# Patient Record
Sex: Female | Born: 1995 | Race: Black or African American | Hispanic: No | Marital: Single | Smoking: Never smoker
Health system: Southern US, Community
[De-identification: ages and names within clinical notes are randomized; demographics above are authoritative.]

## PROBLEM LIST (undated history)

## (undated) DIAGNOSIS — A749 Chlamydial infection, unspecified: Secondary | ICD-10-CM

## (undated) HISTORY — PX: NO PAST SURGERIES: SHX2092

---
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
## 2015-02-14 ENCOUNTER — Inpatient Hospital Stay (HOSPITAL_COMMUNITY)
Admission: AD | Admit: 2015-02-14 | Discharge: 2015-02-14 | Disposition: A | Discharge status: Home / Self Care | Payer: Medicaid Other | Source: Ambulatory Visit | Attending: Obstetrics & Gynecology | Admitting: Obstetrics & Gynecology

## 2015-02-14 ENCOUNTER — Encounter (HOSPITAL_COMMUNITY): Payer: Self-pay | Admitting: *Deleted

## 2015-02-14 DIAGNOSIS — A599 Trichomoniasis, unspecified: Secondary | ICD-10-CM

## 2015-02-14 DIAGNOSIS — N912 Amenorrhea, unspecified: Secondary | ICD-10-CM | POA: Insufficient documentation

## 2015-02-14 DIAGNOSIS — Z3202 Encounter for pregnancy test, result negative: Secondary | ICD-10-CM | POA: Insufficient documentation

## 2015-02-14 DIAGNOSIS — N926 Irregular menstruation, unspecified: Secondary | ICD-10-CM

## 2015-02-14 HISTORY — DX: Chlamydial infection, unspecified: A74.9

## 2015-02-14 LAB — URINALYSIS, ROUTINE W REFLEX MICROSCOPIC
BILIRUBIN URINE: NEGATIVE
Glucose, UA: NEGATIVE mg/dL
Ketones, ur: NEGATIVE mg/dL
Leukocytes, UA: NEGATIVE
NITRITE: NEGATIVE
PH: 6 (ref 5.0–8.0)
Protein, ur: NEGATIVE mg/dL

## 2015-02-14 LAB — URINE MICROSCOPIC-ADD ON

## 2015-02-14 LAB — POCT PREGNANCY, URINE: Preg Test, Ur: NEGATIVE

## 2015-02-14 MED ORDER — METRONIDAZOLE 500 MG PO TABS
2000.0000 mg | ORAL_TABLET | Freq: Once | ORAL | Status: AC
  Administered 2015-02-14: 2000 mg via ORAL
  Filled 2015-02-14: qty 4

## 2015-02-14 NOTE — MAU Note (Signed)
Pt reports she has been having pelvic pain for a few weeks. LMP 12/27/15. Unsure if she is pregnant. No vag discharge.

## 2015-02-14 NOTE — MAU Provider Note (Signed)
History     CSN: [id number]  Arrival date and time: 02/14/15 1443   First Provider Initiated Contact with Patient 02/14/15 1603      Chief Complaint  Patient presents with  . Pelvic Pain   HPI   [name] is a 20 y.o. female G1P0010 presenting to MAU with complaints of pelvic pain and today desires a pregnancy hcg blood level. The patient missed her period and wants to know for sure whether she is pregnant. She had a negative pregnancy test at home.  The abdominal pain that she complains of started at few days ago; the pain is located in the left side of her uterus. She has not taken anything for the pain.   The patient declines a pelvic exam today   Patient is not interested in [facility] pills today.   OB History    Gravida Para Term Preterm AB TAB SAB Ectopic Multiple Living   1    1           Past Medical History  Diagnosis Date  . Chlamydia     No past surgical history on file.  History reviewed. No pertinent family history.  Social History  Substance Use Topics  . Smoking status: Never Smoker   . Smokeless tobacco: None  . Alcohol Use: No    Allergies: No Known Allergies  Prescriptions prior to admission  Medication Sig Dispense Refill Last Dose  . azithromycin (ZITHROMAX Z-PAK) 500 MG tablet Take both tabs at once today. 2 each 0 Completed Course at Unknown  . doxycycline (VIBRAMYCIN) 100 MG capsule Take 1 capsule (100 mg total) by mouth 2 (two) times daily. 28 capsule 0    Results for orders placed or performed during the hospital encounter of 02/14/15 (from the past 48 hour(s))  Urinalysis, Routine w reflex microscopic (not at [facility])     Status: Abnormal   Collection Time: 02/14/15  2:33 PM  Result Value Ref Range   Color, Urine YELLOW YELLOW   APPearance CLEAR CLEAR   Specific Gravity, Urine >1.030 (H) 1.005 - 1.030   pH 6.0 5.0 - 8.0   Glucose, UA NEGATIVE NEGATIVE mg/dL   Hgb urine dipstick SMALL (A) NEGATIVE   Bilirubin Urine NEGATIVE  NEGATIVE   Ketones, ur NEGATIVE NEGATIVE mg/dL   Protein, ur NEGATIVE NEGATIVE mg/dL   Nitrite NEGATIVE NEGATIVE   Leukocytes, UA NEGATIVE NEGATIVE  Urine microscopic-add on     Status: Abnormal   Collection Time: 02/14/15  2:33 PM  Result Value Ref Range   Squamous Epithelial / LPF 0-5 (A) NONE SEEN   WBC, UA 0-5 0 - 5 WBC/hpf   RBC / HPF 0-5 0 - 5 RBC/hpf   Bacteria, UA RARE (A) NONE SEEN   Urine-Other MUCOUS PRESENT     Comment: TRICHOMONAS PRESENT  Pregnancy, urine POC     Status: None   Collection Time: 02/14/15  2:57 PM  Result Value Ref Range   Preg Test, Ur NEGATIVE NEGATIVE    Comment:        THE SENSITIVITY OF THIS METHODOLOGY IS >24 mIU/mL      Review of Systems  Constitutional: Negative for fever and chills.  Gastrointestinal: Positive for abdominal pain. Negative for nausea and vomiting.  Genitourinary: Negative for dysuria.   Physical Exam   Blood pressure 123/95, pulse 78, temperature 98.8 F (37.1 C), temperature source Oral, resp. rate 18, height 5' 6.5" (1.689 m), last menstrual period 12/27/2014.  Physical Exam  Constitutional:  She is oriented to person, place, and time. She appears well-developed and well-nourished. No distress.  HENT:  Head: Normocephalic.  Neurological: She is alert and oriented to person, place, and time.  Skin: She is not diaphoretic.  Psychiatric: Her affect is inappropriate. She expresses impulsivity and inappropriate judgment (burping out loud; talking about drinking vodka during interview).    MAU Course  Procedures  None  MDM  HIV pending  Trichomonas present in the urine.  Patient's friend present at the time of visit. The patient approved personal information being shared in front of visitor.  Patient's visitor upset that I would not draw an Hcg level today.  Patient's friend says "when did that change, I have come here in the past for a blood test." I explained to the patient that I did not have a medical reason to  perform a beta hcg blood level, the patient was agreeable.   Flagyl 2 grams po given in MAU Expedited partner therapy; patient given expedited partner therapy info to be given to her partner  Assessment and Plan   A:  1. Missed period   2. Trichomonas infection     P:  Discharge home in stable condition Condoms always Return to MAU for emergencies Home pregnancy test in one week if needed    Lezlie Lye, NP 02/14/2015 7:13 PM

## 2015-02-14 NOTE — Discharge Instructions (Signed)
Pregnancy Test Information WHAT IS A PREGNANCY TEST? A pregnancy test is used to detect the presence of human chorionic gonadotropin (hCG) in a sample of your urine or blood. hCG is a hormone produced by the cells of the placenta. The placenta is the organ that forms to nourish and support a developing baby. This test requires a sample of either blood or urine. A pregnancy test determines whether you are pregnant or not. HOW ARE PREGNANCY TESTS DONE? Pregnancy tests are done using a home pregnancy test or having a blood or urine test done at your health care provider's office.  Home pregnancy tests require a urine sample.  Most kits use a plastic testing device with a strip of paper that indicates whether there is hCG in your urine.  Follow the test instructions very carefully.  After you urinate on the test stick, markings will appear to let you know whether you are pregnant.  For best results, use your first urine of the morning. That is when the concentration of hCG is highest. Having a blood test to check for pregnancy requires a sample of blood drawn from a vein in your hand or arm. Your health care provider will send your sample to a lab for testing. Results of a pregnancy test will be positive or negative. IS ONE TYPE OF PREGNANCY TEST BETTER THAN ANOTHER? In some cases, a blood test will return a positive result even if a urine test was negative because blood tests are more sensitive. This means blood tests can detect hCG earlier than home pregnancy tests.  HOW ACCURATE ARE HOME PREGNANCY TESTS?  Both types of pregnancy tests are very accurate.  A blood test is about 98% accurate.  When you are far enough along in your pregnancy and when used correctly, home pregnancy tests are equally accurate. CAN ANYTHING INTERFERE WITH HOME PREGNANCY TEST RESULTS?  It is possible for certain conditions to cause an inaccurate test result (false positive or falsenegative).  A false positive is a  positive test result when you are not pregnant. This can happen if you:  Are taking certain medicines, including anticonvulsants or tranquilizers.  Have certain proteins in your blood.  A false negative is a negative test result when you are pregnant. This can happen if you:  Took the test before there was enough hCG to detect. A pregnancy test will not be positive in most women until 3-4 weeks after conception.  Drank a lot of liquid before the test. Diluted urine samples can sometimes give an inaccurate result.  Take certain medicines, such as water pills (diuretics) or some antihistamines. WHAT SHOULD I DO IF I HAVE A POSITIVE PREGNANCY TEST? If you have a positive pregnancy test, schedule an appointment with your health care provider. You might need additional testing to confirm the pregnancy. In the meantime, begin taking a prenatal vitamin, stop smoking, stop drinking alcohol, and do not use street drugs. Talk to your health care provider about how to take care of yourself during your pregnancy. Ask about what to expect from the care you will need throughout pregnancy (prenatal care).   This information is not intended to replace advice given to you by your health care provider. Make sure you discuss any questions you have with your health care provider.   Document Released: 01/01/2003 Document Revised: 01/19/2014 Document Reviewed: 04/25/2013 Elsevier Interactive Patient Education 2016 [employer]. Trichomoniasis Trichomoniasis is an infection caused by an organism called Trichomonas. The infection can affect both women and  men. In women, the outer female genitalia and the vagina are affected. In men, the penis is mainly affected, but the prostate and other reproductive organs can also be involved. Trichomoniasis is a sexually transmitted infection (STI) and is most often passed to another person through sexual contact.  RISK FACTORS  Having unprotected sexual intercourse.  Having  sexual intercourse with an infected partner. SIGNS AND SYMPTOMS  Symptoms of trichomoniasis in women include:  Abnormal gray-green frothy vaginal discharge.  Itching and irritation of the vagina.  Itching and irritation of the area outside the vagina. Symptoms of trichomoniasis in men include:   Penile discharge with or without pain.  Pain during urination. This results from inflammation of the urethra. DIAGNOSIS  Trichomoniasis may be found during a Pap test or physical exam. Your health care provider may use one of the following methods to help diagnose this infection:  Testing the pH of the vagina with a test tape.  Using a vaginal swab test that checks for the Trichomonas organism. A test is available that provides results within a few minutes.  Examining a urine sample.  Testing vaginal secretions. Your health care provider may test you for other STIs, including HIV. TREATMENT   You may be given medicine to fight the infection. Women should inform their health care provider if they could be or are pregnant. Some medicines used to treat the infection should not be taken during pregnancy.  Your health care provider may recommend over-the-counter medicines or creams to decrease itching or irritation.  Your sexual partner will need to be treated if infected.  Your health care provider may test you for infection again 3 months after treatment. HOME CARE INSTRUCTIONS   Take medicines only as directed by your health care provider.  Take over-the-counter medicine for itching or irritation as directed by your health care provider.  Do not have sexual intercourse while you have the infection.  Women should not douche or wear tampons while they have the infection.  Discuss your infection with your partner. Your partner may have gotten the infection from you, or you may have gotten it from your partner.  Have your sex partner get examined and treated if necessary.  Practice  safe, informed, and protected sex.  See your health care provider for other STI testing. SEEK MEDICAL CARE IF:   You still have symptoms after you finish your medicine.  You develop abdominal pain.  You have pain when you urinate.  You have bleeding after sexual intercourse.  You develop a rash.  Your medicine makes you sick or makes you throw up (vomit). MAKE SURE YOU:  Understand these instructions.  Will watch your condition.  Will get help right away if you are not doing well or get worse.   This information is not intended to replace advice given to you by your health care provider. Make sure you discuss any questions you have with your health care provider.   Document Released: 06/24/2000 Document Revised: 01/19/2014 Document Reviewed: 10/10/2012 Elsevier Interactive Patient Education 2016 [employer].    Expedited Partner Therapy:  Information Sheet for Patients and Partners               You have been offered expedited partner therapy (EPT). This information sheet contains important information and warnings you need to be aware of, so please read it carefully.   Expedited Partner Therapy (EPT) is the clinical practice of treating the sexual partners of persons who receive chlamydia, gonorrhea, or trichomoniasis  diagnoses by providing medications or prescriptions to the patient. Patients then provide partners with these therapies without the health-care provider having examined the partner. In other words, EPT is a convenient, fast and private way for patients to help their sexual partners get treated.   Chlamydia and gonorrhea are bacterial infections you get from having sex with a person who is already infected. Trichomoniasis (or trich) is a very common sexually transmitted infection (STI) that is caused by infection with a protozoan parasite called Trichomonas vaginalis.  Many people with these infections dont know it because they feel fine, but without  treatment these infections can cause serious health problems, such as pelvic inflammatory disease, ectopic pregnancy, infertility and increased risk of HIV.   It is important to get treated as soon as possible to protect your health, to avoid spreading these infections to others, and to prevent yourself from becoming re-infected. The good news is these infections can be easily cured with proper antibiotic medicine. The best way to take care of your self is to see a doctor or go to your local health department. If you are not able to see a doctor or other medical provider, you should take EPT.    Recommended Medication: EPT for Chlamydia:  Azithromycin (Zithromax) 1 gram orally in a single dose EPT for Gonorrhea:  Cefixime (Suprax) 400 milligrams orally in a single dose PLUS azithromycin (Zithromax) 1 gram orally in a single dose EPT for Trichomoniasis:  Metronidazole (Flagyl) 2 grams orally in a single dose   These medicines are very safe. However, you should not take them if you have ever had an allergic reaction (like a rash) to any of these medicines: azithromycin (Zithromax), erythromycin, clarithromycin (Biaxin), metronidazole (Flagyl), tinidazole (Tindimax). If you are uncertain about whether you have an allergy, call your medical provider or pharmacist before taking this medicine. If you have a serious, long-term illness like kidney, liver or heart disease, colitis or stomach problems, or you are currently taking other prescription medication, talk to your provider before taking this medication.   Women: If you have lower belly pain, pain during sex, vomiting, or a fever, do not take this medicine. Instead, you should see a medical provider to be certain you do not have pelvic inflammatory disease (PID). PID can be serious and lead to infertility, pregnancy problems or chronic pelvic pain.   Pregnant Women: It is very important for you to see a doctor to get pregnancy services and pre-natal  care. These antibiotics for EPT are safe for pregnant women, but you still need to see a medical provider as soon as possible. It is also important to note that Doxycycline is an alternative therapy for chlamydia, but it should not be taken by someone who is pregnant.   Men: If you have pain or swelling in the testicles or a fever, do not take this medicine and see a medical provider.     Men who have sex with men (MSM): MSM in [place] continue to experience high rates of syphilis and HIV. Many MSM with gonorrhea or chlamydia could also have syphilis and/or HIV and not know it. If you are a man who has sex with other men, it is very important that you see a medical provider and are tested for HIV and syphilis. EPT is not recommended for gonorrhea for MSM.  Recommended treatment for gonorrhea for MSM is Rocephin (shot) AND azithromycin due to decreased cure rate.  Please see your medical provider if this is  the case.    Along with this information sheet is a prescription for the medicine. If you receive a prescription it will be in your name and will indicate your date of birth, or it will be in the name of Expedited Partner Therapy.   In either case, you can have the prescription filled at a pharmacy. You will be responsible for the cost of the medicine, unless you have prescription drug coverage. In that case, you could provide your name so the pharmacy could bill your health plan.   Take the medication as directed. Some people will have a mild, upset stomach, which does not last long. AVOID alcohol 24 hours after taking metronidazole (Flagyl) to reduce the possibility of a disulfiram-like reaction (severe vomiting and abdominal pain).  After taking the medicine, do not have sex for 7 days. Do not share this medicine or give it to anyone else. It is important to tell everyone you have had sex with in the last 60 days that they need to go and get tested for sexually transmitted infections.   Ways  to prevent these and other sexually transmitted infections (STIs):    Abstain from sex. This is the only sure way to avoid getting an STI.   Use barrier methods, such as condoms, consistently and correctly.   Limit the number of sexual partners.   Have regular physical exams, including testing for STIs.   For more information about EPT or other issues pertaining to an STI, please contact your medical provider or the [facility] Department at [phone] or [email or web address]

## 2015-02-15 LAB — HIV ANTIBODY (ROUTINE TESTING W REFLEX): HIV SCREEN 4TH GENERATION: NONREACTIVE

## 2015-03-15 ENCOUNTER — Emergency Department (HOSPITAL_COMMUNITY)
Admission: EM | Admit: 2015-03-15 | Discharge: 2015-03-15 | Disposition: A | Discharge status: Home / Self Care | Payer: Medicaid Other | Attending: Emergency Medicine | Admitting: Emergency Medicine

## 2015-03-15 ENCOUNTER — Encounter (HOSPITAL_COMMUNITY): Payer: Self-pay | Admitting: Emergency Medicine

## 2015-03-15 DIAGNOSIS — Z8619 Personal history of other infectious and parasitic diseases: Secondary | ICD-10-CM | POA: Insufficient documentation

## 2015-03-15 DIAGNOSIS — N898 Other specified noninflammatory disorders of vagina: Secondary | ICD-10-CM | POA: Insufficient documentation

## 2015-03-15 DIAGNOSIS — Z3202 Encounter for pregnancy test, result negative: Secondary | ICD-10-CM | POA: Insufficient documentation

## 2015-03-15 LAB — URINALYSIS, ROUTINE W REFLEX MICROSCOPIC
BILIRUBIN URINE: NEGATIVE
Glucose, UA: NEGATIVE mg/dL
Hgb urine dipstick: NEGATIVE
KETONES UR: NEGATIVE mg/dL
LEUKOCYTES UA: NEGATIVE
NITRITE: NEGATIVE
PROTEIN: NEGATIVE mg/dL
Specific Gravity, Urine: 1.024 (ref 1.005–1.030)
pH: 6 (ref 5.0–8.0)

## 2015-03-15 LAB — POC URINE PREG, ED: PREG TEST UR: NEGATIVE

## 2015-03-15 MED ORDER — METRONIDAZOLE 500 MG PO TABS
2000.0000 mg | ORAL_TABLET | Freq: Once | ORAL | Status: AC
  Administered 2015-03-15: 2000 mg via ORAL
  Filled 2015-03-15: qty 4

## 2015-03-15 NOTE — ED Notes (Signed)
Pt sts recent hx of trichomonas and was treated but still having sx

## 2015-03-15 NOTE — ED Provider Notes (Signed)
CSN: [id number]     Arrival date & time 03/15/15  1036 History   First MD Initiated Contact with Patient 03/15/15 1129     Chief Complaint  Patient presents with  . Vaginal Discharge     Patient is a 20 y.o. female presenting with vaginal discharge. The history is provided by the patient. No language interpreter was used.  Vaginal Discharge  Julie Maddox is a 20 y.o. female who presents to the Emergency Department complaining of vaginal discharge.  She was seen at [facility] one month ago for a foul-smelling vaginal discharge and was diagnosed with Trichomonas. She was treated at that time and she also gave her partner a prescription for treatment. She is unsure if her partner took the treatment or not. For the last couple of days she reports increased foul-smelling vaginal discharge is white in color. She has a "funny feeling" in her lower abdomen but no abdominal pain. She denies any fevers, nausea, vomiting, dysuria.  Past Medical History  Diagnosis Date  . Chlamydia    History reviewed. No pertinent past surgical history. History reviewed. No pertinent family history. Social History  Substance Use Topics  . Smoking status: Never Smoker   . Smokeless tobacco: None  . Alcohol Use: No   OB History    Gravida Para Term Preterm AB TAB SAB Ectopic Multiple Living   1 0 0 0 1 1 0 0 0 0      Review of Systems  Genitourinary: Positive for vaginal discharge.  All other systems reviewed and are negative.     Allergies  Review of patient's allergies indicates no known allergies.  Home Medications   Prior to Admission medications   Not on File   BP 126/95 mmHg  Pulse 72  Temp(Src) 98 F (36.7 C) (Oral)  Resp 18  SpO2 100%  LMP 12/27/2014 Physical Exam  Constitutional: She is oriented to person, place, and time. She appears well-developed and well-nourished.  HENT:  Head: Normocephalic and atraumatic.  Cardiovascular: Normal rate and regular rhythm.   No murmur  heard. Pulmonary/Chest: Effort normal and breath sounds normal. No respiratory distress.  Abdominal: Soft. There is no tenderness. There is no rebound and no guarding.  Musculoskeletal: She exhibits no edema or tenderness.  Neurological: She is alert and oriented to person, place, and time.  Skin: Skin is warm and dry.  Psychiatric: She has a normal mood and affect. Her behavior is normal.  Nursing note and vitals reviewed.   ED Course  Procedures (including critical care time) Labs Review Labs Reviewed  URINALYSIS, ROUTINE W REFLEX MICROSCOPIC (NOT AT [facility], [facility], [facility])  POC URINE PREG, ED    Imaging Review No results found. I have personally reviewed and evaluated these images and lab results as part of my medical decision-making.   EKG Interpretation None      MDM   Final diagnoses:  Vaginal discharge    Patient here for evaluation of vaginal discharge with history of Trichomonas. She has no abdominal pain or abdominal tenderness on examination. Patient refuses pelvic examination because she has to leave. We will treat for possible trichomonas. Discussed with patient that she needs further follow-up with pelvic examination to evaluate for sexual transmitted infection such as gonorrhea and chlamydia.    Quintella Reichert, MD 03/15/15 1151

## 2015-03-15 NOTE — ED Notes (Signed)
Discharge vitals in.  Pt getting dressed.

## 2015-03-15 NOTE — Discharge Instructions (Signed)
Trichomoniasis Trichomoniasis is an infection caused by an organism called Trichomonas. The infection can affect both women and men. In women, the outer female genitalia and the vagina are affected. In men, the penis is mainly affected, but the prostate and other reproductive organs can also be involved. Trichomoniasis is a sexually transmitted infection (STI) and is most often passed to another person through sexual contact.  RISK FACTORS  Having unprotected sexual intercourse.  Having sexual intercourse with an infected partner. SIGNS AND SYMPTOMS  Symptoms of trichomoniasis in women include:  Abnormal gray-green frothy vaginal discharge.  Itching and irritation of the vagina.  Itching and irritation of the area outside the vagina. Symptoms of trichomoniasis in men include:   Penile discharge with or without pain.  Pain during urination. This results from inflammation of the urethra. DIAGNOSIS  Trichomoniasis may be found during a Pap test or physical exam. Your health care provider may use one of the following methods to help diagnose this infection:  Testing the pH of the vagina with a test tape.  Using a vaginal swab test that checks for the Trichomonas organism. A test is available that provides results within a few minutes.  Examining a urine sample.  Testing vaginal secretions. Your health care provider may test you for other STIs, including HIV. TREATMENT   You may be given medicine to fight the infection. Women should inform their health care provider if they could be or are pregnant. Some medicines used to treat the infection should not be taken during pregnancy.  Your health care provider may recommend over-the-counter medicines or creams to decrease itching or irritation.  Your sexual partner will need to be treated if infected.  Your health care provider may test you for infection again 3 months after treatment. HOME CARE INSTRUCTIONS   Take medicines only as  directed by your health care provider.  Take over-the-counter medicine for itching or irritation as directed by your health care provider.  Do not have sexual intercourse while you have the infection.  Women should not douche or wear tampons while they have the infection.  Discuss your infection with your partner. Your partner may have gotten the infection from you, or you may have gotten it from your partner.  Have your sex partner get examined and treated if necessary.  Practice safe, informed, and protected sex.  See your health care provider for other STI testing. SEEK MEDICAL CARE IF:   You still have symptoms after you finish your medicine.  You develop abdominal pain.  You have pain when you urinate.  You have bleeding after sexual intercourse.  You develop a rash.  Your medicine makes you sick or makes you throw up (vomit). MAKE SURE YOU:  Understand these instructions.  Will watch your condition.  Will get help right away if you are not doing well or get worse.   This information is not intended to replace advice given to you by your health care provider. Make sure you discuss any questions you have with your health care provider.   Document Released: 06/24/2000 Document Revised: 01/19/2014 Document Reviewed: 10/10/2012 Elsevier Interactive Patient Education 2016 [employer]  

## 2015-03-15 NOTE — ED Notes (Signed)
[facility] and found to have ytrich and she gave notice to her boyfriend but she is unsure if he gotb tx or not now has vag d/c again with odor is in hurry does not want pelvic

## 2016-01-13 NOTE — L&D Delivery Note (Signed)
Patient is 21 y.o. G2P0010 [redacted]w[redacted]d admitted for SOL. Progressed well with expectant management. AROM shortly before delivery with meconium.  Prenatal course also complicated by anxiety not on medications.  Delivery Note At 5:19 PM a viable female was delivered via Vaginal, Spontaneous Delivery (Presentation: compound, LOA).  APGAR: 8/9; weight pending.   Placenta status: to pathology.  Cord: none with no complications.  Cord pH: N/A  Anesthesia:  Epidural Episiotomy: None Lacerations: 2nd degree; combined perineal and labial Suture Repair: 3.0 Vicryl x2 Est. Blood Loss (mL):  350  Mom to postpartum.  Baby to Couplet care / Skin to Skin.   Upon arrival, head was low in the pelvis with +2 station. Patient was complete and pushing well. She pushed with good maternal effort four times. Just before final contraction, heart rate dropped to 80s persistently. At onset of final contraction, she delivered a viable female infant in cephalic, compound (both arms), LOA position. No nuchal cord present. Baby delivered without difficulty (anterior shoulder delivered with ease), was noted to have good tone and place on maternal abdomen for oral suctioning, drying and stimulation. Baby had active passage of meconium and first urination just after delivery. Delayed cord clamping performed. Placenta delivered spontaneously immediately after delivery of infant without need for cord traction. Fundus firm with massage and Pitocin. Perineum inspected and found to have deep 2nd degree laceration with connected right labial laceration. Rectal exam performed with intact anal sphincter. Laceration then repaired with 3-0 Vicryl x2 with good hemostasis achieved. Counts of sharps, instruments, and lap pads were all correct.    Dr. Lambert Keto supervised delivery and repair.  Donnita Falls, MD Family Medicine Resident, PGY-3 09/23/2016 6:09 PM   OB FELLOW DELIVERY ATTESTATION  I was gloved and present for the delivery in its  entirety, and I agree with the above resident's note.    Gailen Shelter, MD OB Fellow 9:06 PM

## 2016-02-27 ENCOUNTER — Ambulatory Visit (INDEPENDENT_AMBULATORY_CARE_PROVIDER_SITE_OTHER): Payer: Medicaid Other | Admitting: Obstetrics & Gynecology

## 2016-02-27 ENCOUNTER — Encounter: Payer: Self-pay | Admitting: Obstetrics & Gynecology

## 2016-02-27 ENCOUNTER — Other Ambulatory Visit (HOSPITAL_COMMUNITY)
Admission: RE | Admit: 2016-02-27 | Discharge: 2016-02-27 | Disposition: A | Discharge status: Home / Self Care | Payer: Medicaid Other | Source: Ambulatory Visit | Attending: Obstetrics & Gynecology | Admitting: Obstetrics & Gynecology

## 2016-02-27 VITALS — BP 119/82 | HR 70 | Wt 137.0 lb

## 2016-02-27 DIAGNOSIS — Z3481 Encounter for supervision of other normal pregnancy, first trimester: Secondary | ICD-10-CM | POA: Diagnosis not present

## 2016-02-27 DIAGNOSIS — Z3401 Encounter for supervision of normal first pregnancy, first trimester: Secondary | ICD-10-CM

## 2016-02-27 DIAGNOSIS — Z23 Encounter for immunization: Secondary | ICD-10-CM

## 2016-02-27 DIAGNOSIS — Z3400 Encounter for supervision of normal first pregnancy, unspecified trimester: Secondary | ICD-10-CM

## 2016-02-27 DIAGNOSIS — Z3490 Encounter for supervision of normal pregnancy, unspecified, unspecified trimester: Secondary | ICD-10-CM | POA: Insufficient documentation

## 2016-02-27 NOTE — Addendum Note (Signed)
Addended by: Maryruth Eve on: 02/27/2016 05:04 PM   Modules accepted: Orders

## 2016-02-27 NOTE — Progress Notes (Signed)
  Subjective:    Julie Maddox is a G2P0010 [redacted]w[redacted]d being seen today for her first obstetrical visit.  Her obstetrical history is significant for first pregnancy. Patient does intend to breast feed. Pregnancy history fully reviewed.  Patient reports nausea and vomiting.  Vitals:   02/27/16 1334  BP: 119/82  Pulse: 70  Weight: 137 lb (62.1 kg)    HISTORY: OB History  Gravida Para Term Preterm AB Living  2 0 0 0 1 0  SAB TAB Ectopic Multiple Live Births  0 1 0 0      # Outcome Date GA Lbr Len/2nd Weight Sex Delivery Anes PTL Lv  2 Current           1 TAB              Past Medical History:  Diagnosis Date  . Chlamydia    History reviewed. No pertinent surgical history. Family History  Problem Relation Age of Onset  . Parkinson's disease Maternal Grandmother   . Cancer Paternal Grandmother      Exam    Uterus:     Pelvic Exam:    Perineum: No Hemorrhoids   Vulva: normal   Vagina:  thin grey discharge   pH:     Cervix: no lesions   Adnexa: no mass, fullness, tenderness   Bony Pelvis: average  System: Breast:  normal appearance, no masses or tenderness   Skin: normal coloration and turgor, no rashes    Neurologic: oriented, normal mood   Extremities: normal strength, tone, and muscle mass   HEENT PERRLA and neck supple with midline trachea   Mouth/Teeth dental hygiene good   Neck supple   Cardiovascular: regular rate and rhythm, no murmurs or gallops   Respiratory:  appears well, vitals normal, no respiratory distress, acyanotic, normal RR, neck free of mass or lymphadenopathy, chest clear, no wheezing, crepitations, rhonchi, normal symmetric air entry   Abdomen: soft, non-tender; bowel sounds normal; no masses,  no organomegaly   Urinary: urethral meatus normal      Assessment:    Pregnancy: G2P0010 Patient Active Problem List   Diagnosis Date Noted  . Encounter for supervision of normal pregnancy, antepartum 02/27/2016        Plan:      Initial labs drawn. Prenatal vitamins. Problem list reviewed and updated. Genetic Screening discussed First Screen: ordered.  Ultrasound discussed; fetal survey: 18+ weeks.  Follow up in 4 weeks. 50% of 30 min visit spent on counseling and coordination of care.  Wet prep sent, need ROI for pap done at [facility] 02/27/2016

## 2016-02-27 NOTE — Progress Notes (Signed)
NOB pt c/o vaginal spotting after wiping last night. Denies cramping and pain. Wants baby scripts.

## 2016-02-29 LAB — OBSTETRIC PANEL, INCLUDING HIV
Antibody Screen: NEGATIVE
BASOS ABS: 0 10*3/uL (ref 0.0–0.2)
Basos: 0 %
EOS (ABSOLUTE): 0.2 10*3/uL (ref 0.0–0.4)
EOS: 3 %
HEMOGLOBIN: 11.9 g/dL (ref 11.1–15.9)
HEP B S AG: NEGATIVE
HIV Screen 4th Generation wRfx: NONREACTIVE
Hematocrit: 34.5 % (ref 34.0–46.6)
IMMATURE GRANS (ABS): 0 10*3/uL (ref 0.0–0.1)
IMMATURE GRANULOCYTES: 0 %
LYMPHS ABS: 1.8 10*3/uL (ref 0.7–3.1)
LYMPHS: 32 %
MCH: 29.9 pg (ref 26.6–33.0)
MCHC: 34.5 g/dL (ref 31.5–35.7)
MCV: 87 fL (ref 79–97)
MONOCYTES: 9 %
Monocytes Absolute: 0.5 10*3/uL (ref 0.1–0.9)
NEUTROS PCT: 56 %
Neutrophils Absolute: 3.1 10*3/uL (ref 1.4–7.0)
PLATELETS: 221 10*3/uL (ref 150–379)
RBC: 3.98 x10E6/uL (ref 3.77–5.28)
RDW: 14.8 % (ref 12.3–15.4)
RH TYPE: POSITIVE
RPR: NONREACTIVE
Rubella Antibodies, IGG: 1.51 index (ref >0.99)
WBC: 5.5 10*3/uL (ref 3.4–10.8)

## 2016-02-29 LAB — HEMOGLOBINOPATHY EVALUATION
HGB A: 97.2 % (ref 96.4–98.8)
HGB C: 0 %
HGB S: 0 %
HGB VARIANT: 0 %
Hemoglobin A2 Quantitation: 2.8 % (ref 1.8–3.2)
Hemoglobin F Quantitation: 0 % (ref 0.0–2.0)

## 2016-02-29 LAB — VARICELLA ZOSTER ANTIBODY, IGG: Varicella zoster IgG: 1130 index (ref >165)

## 2016-03-02 LAB — CERVICOVAGINAL ANCILLARY ONLY: Trichomonas: NEGATIVE

## 2016-03-02 LAB — URINE CYTOLOGY ANCILLARY ONLY
CHLAMYDIA, DNA PROBE: NEGATIVE
Neisseria Gonorrhea: NEGATIVE

## 2016-03-03 LAB — URINE CULTURE, OB REFLEX

## 2016-03-03 LAB — CULTURE, OB URINE

## 2016-03-05 LAB — URINE CYTOLOGY ANCILLARY ONLY: Candida vaginitis: NEGATIVE

## 2016-03-17 ENCOUNTER — Encounter (HOSPITAL_COMMUNITY): Payer: Self-pay

## 2016-03-17 ENCOUNTER — Ambulatory Visit (HOSPITAL_COMMUNITY)
Admission: RE | Admit: 2016-03-17 | Discharge: 2016-03-17 | Disposition: A | Discharge status: Home / Self Care | Payer: Medicaid Other | Source: Ambulatory Visit | Attending: Obstetrics & Gynecology | Admitting: Obstetrics & Gynecology

## 2016-03-17 DIAGNOSIS — Z3400 Encounter for supervision of normal first pregnancy, unspecified trimester: Secondary | ICD-10-CM | POA: Diagnosis present

## 2016-03-17 DIAGNOSIS — Z3A13 13 weeks gestation of pregnancy: Secondary | ICD-10-CM | POA: Insufficient documentation

## 2016-03-17 DIAGNOSIS — Z3682 Encounter for antenatal screening for nuchal translucency: Secondary | ICD-10-CM | POA: Insufficient documentation

## 2016-03-17 NOTE — ED Notes (Signed)
Pt states she ate a brownie which a friend gave her.  Pt hasn't told friend she was pregnant but once she found out it had marijuana baked in it, she discontinued eating.

## 2016-03-19 ENCOUNTER — Telehealth: Payer: Self-pay

## 2016-03-19 NOTE — Telephone Encounter (Signed)
Spoke to patient and advised of results and rx sent. 

## 2016-03-20 ENCOUNTER — Other Ambulatory Visit: Payer: Self-pay

## 2016-03-25 ENCOUNTER — Encounter: Payer: Self-pay | Admitting: *Deleted

## 2016-03-26 ENCOUNTER — Encounter: Payer: Medicaid Other | Admitting: Obstetrics and Gynecology

## 2016-03-31 ENCOUNTER — Telehealth: Payer: Self-pay

## 2016-03-31 DIAGNOSIS — N760 Acute vaginitis: Principal | ICD-10-CM

## 2016-03-31 DIAGNOSIS — B9689 Other specified bacterial agents as the cause of diseases classified elsewhere: Secondary | ICD-10-CM

## 2016-03-31 MED ORDER — METRONIDAZOLE 500 MG PO TABS: 500.0000 mg | ORAL_TABLET | Freq: Two times a day (BID) | ORAL | 0 refills | Status: DC

## 2016-03-31 NOTE — Telephone Encounter (Signed)
Patient called and spoke with front staff and advised that she never received her rx, sent per provider order.

## 2016-04-07 ENCOUNTER — Ambulatory Visit (INDEPENDENT_AMBULATORY_CARE_PROVIDER_SITE_OTHER): Payer: Medicaid Other | Admitting: Obstetrics & Gynecology

## 2016-04-07 VITALS — BP 107/73 | HR 75 | Wt 141.0 lb

## 2016-04-07 DIAGNOSIS — Z3400 Encounter for supervision of normal first pregnancy, unspecified trimester: Secondary | ICD-10-CM

## 2016-04-07 DIAGNOSIS — Z3402 Encounter for supervision of normal first pregnancy, second trimester: Secondary | ICD-10-CM

## 2016-04-07 NOTE — Progress Notes (Signed)
   PRENATAL VISIT NOTE  Subjective:  Julie Maddox is a 21 y.o. G2P0010 at [redacted]w[redacted]d being seen today for ongoing prenatal care.  She is currently monitored for the following issues for this low-risk pregnancy and has Encounter for supervision of normal pregnancy, antepartum on her problem list.  Patient reports no complaints.  Contractions: Regular. Vag. Bleeding: None.  Movement: Present. Denies leaking of fluid.   The following portions of the patient's history were reviewed and updated as appropriate: allergies, current medications, past family history, past medical history, past social history, past surgical history and problem list. Problem list updated.  Objective:   Vitals:   04/07/16 1323  BP: 107/73  Pulse: 75  Weight: 141 lb (64 kg)    Fetal Status: Fetal Heart Rate (bpm): 154 Fundal Height: 16 cm Movement: Present     General:  Alert, oriented and cooperative. Patient is in no acute distress.  Skin: Skin is warm and dry. No rash noted.   Cardiovascular: Normal heart rate noted  Respiratory: Normal respiratory effort, no problems with respiration noted  Abdomen: Soft, gravid, appropriate for gestational age. Pain/Pressure: Absent     Pelvic:  Cervical exam deferred        Extremities: Normal range of motion.  Edema: None  Mental Status: Normal mood and affect. Normal behavior. Normal judgment and thought content.   Assessment and Plan:  Pregnancy: G2P0010 at [redacted]w[redacted]d  1. Supervision of normal first pregnancy, antepartum Normal NT screen - AFP, Serum, Open Spina Bifida  Preterm labor symptoms and general obstetric precautions including but not limited to vaginal bleeding, contractions, leaking of fluid and fetal movement were reviewed in detail with the patient. Please refer to After Visit Summary for other counseling recommendations.  Return in about 4 weeks (around 05/05/2016).   Woodroe Mode, MD

## 2016-04-07 NOTE — Patient Instructions (Signed)

## 2016-04-09 LAB — AFP, SERUM, OPEN SPINA BIFIDA
AFP MoM: 1.17
AFP VALUE AFPOSL: 40.6 ng/mL
GEST. AGE ON COLLECTION DATE: 16.1 wk
Maternal Age At EDD: 23.5 years
OSBR RISK 1 IN: 7137
Test Results:: NEGATIVE
WEIGHT: 141 [lb_av]

## 2016-04-14 ENCOUNTER — Other Ambulatory Visit: Payer: Self-pay | Admitting: *Deleted

## 2016-04-14 DIAGNOSIS — B379 Candidiasis, unspecified: Secondary | ICD-10-CM

## 2016-04-14 DIAGNOSIS — T3695XA Adverse effect of unspecified systemic antibiotic, initial encounter: Principal | ICD-10-CM

## 2016-04-14 MED ORDER — TERCONAZOLE 0.4 % VA CREA: 1.0000 | TOPICAL_CREAM | Freq: Every day | VAGINAL | 0 refills | Status: DC

## 2016-04-14 NOTE — Progress Notes (Signed)
Pt called to office stating signs of yeast infection.  Pt was recently tx for BV.   Terazol 7 sent to pharmacy per protocol. Pt advised to call back if tx does to relieve symptoms.

## 2016-05-05 ENCOUNTER — Ambulatory Visit (INDEPENDENT_AMBULATORY_CARE_PROVIDER_SITE_OTHER): Payer: Medicaid Other | Admitting: Obstetrics and Gynecology

## 2016-05-05 VITALS — BP 113/73 | HR 78 | Wt 142.0 lb

## 2016-05-05 DIAGNOSIS — Z3400 Encounter for supervision of normal first pregnancy, unspecified trimester: Secondary | ICD-10-CM

## 2016-05-05 DIAGNOSIS — R350 Frequency of micturition: Secondary | ICD-10-CM

## 2016-05-05 DIAGNOSIS — Z3482 Encounter for supervision of other normal pregnancy, second trimester: Secondary | ICD-10-CM

## 2016-05-05 LAB — POCT URINALYSIS DIPSTICK
Bilirubin, UA: NEGATIVE
Glucose, UA: NEGATIVE
KETONES UA: NEGATIVE
LEUKOCYTES UA: NEGATIVE
NITRITE UA: NEGATIVE
Spec Grav, UA: <=1.005 — AB (ref 1.010–1.025)
Urobilinogen, UA: 0.2 E.U./dL
pH, UA: 7.5 (ref 5.0–8.0)

## 2016-05-05 NOTE — Patient Instructions (Signed)
Contraception Choices Contraception (birth control) is the use of any methods or devices to prevent pregnancy. Below are some methods to help avoid pregnancy. Hormonal methods  Contraceptive implant. This is a thin, plastic tube containing progesterone hormone. It does not contain estrogen hormone. Your health care provider inserts the tube in the inner part of the upper arm. The tube can remain in place for up to 3 years. After 3 years, the implant must be removed. The implant prevents the ovaries from releasing an egg (ovulation), thickens the cervical mucus to prevent sperm from entering the uterus, and thins the lining of the inside of the uterus.  Progesterone-only injections. These injections are given every 3 months by your health care provider to prevent pregnancy. This synthetic progesterone hormone stops the ovaries from releasing eggs. It also thickens cervical mucus and changes the uterine lining. This makes it harder for sperm to survive in the uterus.  Birth control pills. These pills contain estrogen and progesterone hormone. They work by preventing the ovaries from releasing eggs (ovulation). They also cause the cervical mucus to thicken, preventing the sperm from entering the uterus. Birth control pills are prescribed by a health care provider.Birth control pills can also be used to treat heavy periods.  Minipill. This type of birth control pill contains only the progesterone hormone. They are taken every day of each month and must be prescribed by your health care provider.  Birth control patch. The patch contains hormones similar to those in birth control pills. It must be changed once a week and is prescribed by a health care provider.  Vaginal ring. The ring contains hormones similar to those in birth control pills. It is left in the vagina for 3 weeks, removed for 1 week, and then a new one is put back in place. The patient must be comfortable inserting and removing the ring from  the vagina.A health care provider's prescription is necessary.  Emergency contraception. Emergency contraceptives prevent pregnancy after unprotected sexual intercourse. This pill can be taken right after sex or up to 5 days after unprotected sex. It is most effective the sooner you take the pills after having sexual intercourse. Most emergency contraceptive pills are available without a prescription. Check with your pharmacist. Do not use emergency contraception as your only form of birth control. Barrier methods  Female condom. This is a thin sheath (latex or rubber) that is worn over the penis during sexual intercourse. It can be used with spermicide to increase effectiveness.  Female condom. This is a soft, loose-fitting sheath that is put into the vagina before sexual intercourse.  Diaphragm. This is a soft, latex, dome-shaped barrier that must be fitted by a health care provider. It is inserted into the vagina, along with a spermicidal jelly. It is inserted before intercourse. The diaphragm should be left in the vagina for 6 to 8 hours after intercourse.  Cervical cap. This is a round, soft, latex or plastic cup that fits over the cervix and must be fitted by a health care provider. The cap can be left in place for up to 48 hours after intercourse.  Sponge. This is a soft, circular piece of polyurethane foam. The sponge has spermicide in it. It is inserted into the vagina after wetting it and before sexual intercourse.  Spermicides. These are chemicals that kill or block sperm from entering the cervix and uterus. They come in the form of creams, jellies, suppositories, foam, or tablets. They do not require a prescription. They   are inserted into the vagina with an applicator before having sexual intercourse. The process must be repeated every time you have sexual intercourse. Intrauterine contraception  Intrauterine device (IUD). This is a T-shaped device that is put in a woman's uterus during  a menstrual period to prevent pregnancy. There are 2 types:  Copper IUD. This type of IUD is wrapped in copper wire and is placed inside the uterus. Copper makes the uterus and fallopian tubes produce a fluid that kills sperm. It can stay in place for 10 years.  Hormone IUD. This type of IUD contains the hormone progestin (synthetic progesterone). The hormone thickens the cervical mucus and prevents sperm from entering the uterus, and it also thins the uterine lining to prevent implantation of a fertilized egg. The hormone can weaken or kill the sperm that get into the uterus. It can stay in place for 3-5 years, depending on which type of IUD is used. Permanent methods of contraception  Female tubal ligation. This is when the woman's fallopian tubes are surgically sealed, tied, or blocked to prevent the egg from traveling to the uterus.  Hysteroscopic sterilization. This involves placing a small coil or insert into each fallopian tube. Your doctor uses a technique called hysteroscopy to do the procedure. The device causes scar tissue to form. This results in permanent blockage of the fallopian tubes, so the sperm cannot fertilize the egg. It takes about 3 months after the procedure for the tubes to become blocked. You must use another form of birth control for these 3 months.  Female sterilization. This is when the female has the tubes that carry sperm tied off (vasectomy).This blocks sperm from entering the vagina during sexual intercourse. After the procedure, the man can still ejaculate fluid (semen). Natural planning methods  Natural family planning. This is not having sexual intercourse or using a barrier method (condom, diaphragm, cervical cap) on days the woman could become pregnant.  Calendar method. This is keeping track of the length of each menstrual cycle and identifying when you are fertile.  Ovulation method. This is avoiding sexual intercourse during ovulation.  Symptothermal method.  This is avoiding sexual intercourse during ovulation, using a thermometer and ovulation symptoms.  Post-ovulation method. This is timing sexual intercourse after you have ovulated. Regardless of which type or method of contraception you choose, it is important that you use condoms to protect against the transmission of sexually transmitted infections (STIs). Talk with your health care provider about which form of contraception is most appropriate for you. This information is not intended to replace advice given to you by your health care provider. Make sure you discuss any questions you have with your health care provider. Document Released: 12/29/2004 Document Revised: 06/06/2015 Document Reviewed: 06/23/2012 Elsevier Interactive Patient Education  2017 [place] of Pregnancy The second trimester is from week 14 through week 27 (months 4 through 6). The second trimester is often a time when you feel your best. Your body has adjusted to being pregnant, and you begin to feel better physically. Usually, morning sickness has lessened or quit completely, you may have more energy, and you may have an increase in appetite. The second trimester is also a time when the fetus is growing rapidly. At the end of the sixth month, the fetus is about 9 inches long and weighs about 1 pounds. You will likely begin to feel the baby move (quickening) between 16 and 20 weeks of pregnancy. Body changes during your second  trimester Your body continues to go through many changes during your second trimester. The changes vary from woman to woman.  Your weight will continue to increase. You will notice your lower abdomen bulging out.  You may begin to get stretch marks on your hips, abdomen, and breasts.  You may develop headaches that can be relieved by medicines. The medicines should be approved by your health care provider.  You may urinate more often because the fetus is pressing on your  bladder.  You may develop or continue to have heartburn as a result of your pregnancy.  You may develop constipation because certain hormones are causing the muscles that push waste through your intestines to slow down.  You may develop hemorrhoids or swollen, bulging veins (varicose veins).  You may have back pain. This is caused by:  Weight gain.  Pregnancy hormones that are relaxing the joints in your pelvis.  A shift in weight and the muscles that support your balance.  Your breasts will continue to grow and they will continue to become tender.  Your gums may bleed and may be sensitive to brushing and flossing.  Dark spots or blotches (chloasma, mask of pregnancy) may develop on your face. This will likely fade after the baby is born.  A dark line from your belly button to the pubic area (linea nigra) may appear. This will likely fade after the baby is born.  You may have changes in your hair. These can include thickening of your hair, rapid growth, and changes in texture. Some women also have hair loss during or after pregnancy, or hair that feels dry or thin. Your hair will most likely return to normal after your baby is born. What to expect at prenatal visits During a routine prenatal visit:  You will be weighed to make sure you and the fetus are growing normally.  Your blood pressure will be taken.  Your abdomen will be measured to track your baby's growth.  The fetal heartbeat will be listened to.  Any test results from the previous visit will be discussed. Your health care provider may ask you:  How you are feeling.  If you are feeling the baby move.  If you have had any abnormal symptoms, such as leaking fluid, bleeding, severe headaches, or abdominal cramping.  If you are using any tobacco products, including cigarettes, chewing tobacco, and electronic cigarettes.  If you have any questions. Other tests that may be performed during your second trimester  include:  Blood tests that check for:  Low iron levels (anemia).  High blood sugar that affects pregnant women (gestational diabetes) between 66 and 28 weeks.  Rh antibodies. This is to check for a protein on red blood cells (Rh factor).  Urine tests to check for infections, diabetes, or protein in the urine.  An ultrasound to confirm the proper growth and development of the baby.  An amniocentesis to check for possible genetic problems.  Fetal screens for spina bifida and Down syndrome.  HIV (human immunodeficiency virus) testing. Routine prenatal testing includes screening for HIV, unless you choose not to have this test. Follow these instructions at home: Medicines   Follow your health care provider's instructions regarding medicine use. Specific medicines may be either safe or unsafe to take during pregnancy.  Take a prenatal vitamin that contains at least 600 micrograms (mcg) of folic acid.  If you develop constipation, try taking a stool softener if your health care provider approves. Eating and drinking  Eat a balanced diet that includes fresh fruits and vegetables, whole grains, good sources of protein such as meat, eggs, or tofu, and low-fat dairy. Your health care provider will help you determine the amount of weight gain that is right for you.  Avoid raw meat and uncooked cheese. These carry germs that can cause birth defects in the baby.  If you have low calcium intake from food, talk to your health care provider about whether you should take a daily calcium supplement.  Limit foods that are high in fat and processed sugars, such as fried and sweet foods.  To prevent constipation:  Drink enough fluid to keep your urine clear or pale yellow.  Eat foods that are high in fiber, such as fresh fruits and vegetables, whole grains, and beans. Activity   Exercise only as directed by your health care provider. Most women can continue their usual exercise routine during  pregnancy. Try to exercise for 30 minutes at least 5 days a week. Stop exercising if you experience uterine contractions.  Avoid heavy lifting, wear low heel shoes, and practice good posture.  A sexual relationship may be continued unless your health care provider directs you otherwise. Relieving pain and discomfort   Wear a good support bra to prevent discomfort from breast tenderness.  Take warm sitz baths to soothe any pain or discomfort caused by hemorrhoids. Use hemorrhoid cream if your health care provider approves.  Rest with your legs elevated if you have leg cramps or low back pain.  If you develop varicose veins, wear support hose. Elevate your feet for 15 minutes, 3-4 times a day. Limit salt in your diet. Prenatal Care   Write down your questions. Take them to your prenatal visits.  Keep all your prenatal visits as told by your health care provider. This is important. Safety   Wear your seat belt at all times when driving.  Make a list of emergency phone numbers, including numbers for family, friends, the hospital, and police and fire departments. General instructions   Ask your health care provider for a referral to a local prenatal education class. Begin classes no later than the beginning of month 6 of your pregnancy.  Ask for help if you have counseling or nutritional needs during pregnancy. Your health care provider can offer advice or refer you to specialists for help with various needs.  Do not use hot tubs, steam rooms, or saunas.  Do not douche or use tampons or scented sanitary pads.  Do not cross your legs for long periods of time.  Avoid cat litter boxes and soil used by cats. These carry germs that can cause birth defects in the baby and possibly loss of the fetus by miscarriage or stillbirth.  Avoid all smoking, herbs, alcohol, and unprescribed drugs. Chemicals in these products can affect the formation and growth of the baby.  Do not use any products  that contain nicotine or tobacco, such as cigarettes and e-cigarettes. If you need help quitting, ask your health care provider.  Visit your dentist if you have not gone yet during your pregnancy. Use a soft toothbrush to brush your teeth and be gentle when you floss. Contact a health care provider if:  You have dizziness.  You have mild pelvic cramps, pelvic pressure, or nagging pain in the abdominal area.  You have persistent nausea, vomiting, or diarrhea.  You have a bad smelling vaginal discharge.  You have pain when you urinate. Get help right away if:  You have a fever.  You are leaking fluid from your vagina.  You have spotting or bleeding from your vagina.  You have severe abdominal cramping or pain.  You have rapid weight gain or weight loss.  You have shortness of breath with chest pain.  You notice sudden or extreme swelling of your face, hands, ankles, feet, or legs.  You have not felt your baby move in over an hour.  You have severe headaches that do not go away when you take medicine.  You have vision changes. Summary  The second trimester is from week 14 through week 27 (months 4 through 6). It is also a time when the fetus is growing rapidly.  Your body goes through many changes during pregnancy. The changes vary from woman to woman.  Avoid all smoking, herbs, alcohol, and unprescribed drugs. These chemicals affect the formation and growth your baby.  Do not use any tobacco products, such as cigarettes, chewing tobacco, and e-cigarettes. If you need help quitting, ask your health care provider.  Contact your health care provider if you have any questions. Keep all prenatal visits as told by your health care provider. This is important. This information is not intended to replace advice given to you by your health care provider. Make sure you discuss any questions you have with your health care provider. Document Released: 12/23/2000 Document Revised:  06/06/2015 Document Reviewed: 03/01/2012 Elsevier Interactive Patient Education  2017 [employer].

## 2016-05-05 NOTE — Progress Notes (Signed)
   PRENATAL VISIT NOTE  Subjective:  Julie Maddox is a 21 y.o. G2P0010 at [redacted]w[redacted]d being seen today for ongoing prenatal care.  She is currently monitored for the following issues for this low-risk pregnancy and has Encounter for supervision of normal pregnancy, antepartum on her problem list.  Patient reports no complaints.  Contractions: Not present.  .  Movement: Present. Denies leaking of fluid.   The following portions of the patient's history were reviewed and updated as appropriate: allergies, current medications, past family history, past medical history, past social history, past surgical history and problem list. Problem list updated.  Objective:   Vitals:   05/05/16 1317  BP: 113/73  Pulse: 78  Weight: 142 lb (64.4 kg)    Fetal Status: Fetal Heart Rate (bpm): 140 Fundal Height: 20 cm Movement: Present     General:  Alert, oriented and cooperative. Patient is in no acute distress.  Skin: Skin is warm and dry. No rash noted.   Cardiovascular: Normal heart rate noted  Respiratory: Normal respiratory effort, no problems with respiration noted  Abdomen: Soft, gravid, appropriate for gestational age. Pain/Pressure: Absent     Pelvic:  Cervical exam deferred        Extremities: Normal range of motion.  Edema: None  Mental Status: Normal mood and affect. Normal behavior. Normal judgment and thought content.   Assessment and Plan:  Pregnancy: G2P0010 at [redacted]w[redacted]d  1. Supervision of normal first pregnancy, antepartum Patient is doing well without complaints Third trimester labs next visit Anatomy ultrasound ordered Patient is undecided on contraception - [place] MFM OB COMP + 14 WK; Future   2. Frequent urination - Culture, OB Urine - POCT urinalysis dipstick  General obstetric precautions including but not limited to vaginal bleeding, contractions, leaking of fluid and fetal movement were reviewed in detail with the patient. Please refer to After Visit Summary for other  counseling recommendations.  Return in about 8 weeks (around 06/30/2016) for ROB, 2 hr glucola next visit.   Mora Bellman, MD

## 2016-05-08 LAB — URINE CULTURE, OB REFLEX

## 2016-05-08 LAB — CULTURE, OB URINE

## 2016-05-13 ENCOUNTER — Other Ambulatory Visit: Payer: Self-pay | Admitting: Obstetrics and Gynecology

## 2016-05-13 ENCOUNTER — Ambulatory Visit (HOSPITAL_COMMUNITY)
Admission: RE | Admit: 2016-05-13 | Discharge: 2016-05-13 | Disposition: A | Discharge status: Home / Self Care | Payer: Medicaid Other | Source: Ambulatory Visit | Attending: Obstetrics and Gynecology | Admitting: Obstetrics and Gynecology

## 2016-05-13 DIAGNOSIS — O99212 Obesity complicating pregnancy, second trimester: Secondary | ICD-10-CM | POA: Insufficient documentation

## 2016-05-13 DIAGNOSIS — Z369 Encounter for antenatal screening, unspecified: Secondary | ICD-10-CM

## 2016-05-13 DIAGNOSIS — E66811 Obesity, class 1: Secondary | ICD-10-CM

## 2016-05-13 DIAGNOSIS — Z3689 Encounter for other specified antenatal screening: Secondary | ICD-10-CM | POA: Diagnosis present

## 2016-05-13 DIAGNOSIS — E669 Obesity, unspecified: Secondary | ICD-10-CM | POA: Insufficient documentation

## 2016-05-13 DIAGNOSIS — Z3400 Encounter for supervision of normal first pregnancy, unspecified trimester: Secondary | ICD-10-CM

## 2016-05-13 DIAGNOSIS — Z3A21 21 weeks gestation of pregnancy: Secondary | ICD-10-CM

## 2016-05-14 ENCOUNTER — Other Ambulatory Visit: Payer: Self-pay | Admitting: Obstetrics and Gynecology

## 2016-05-14 DIAGNOSIS — Z3480 Encounter for supervision of other normal pregnancy, unspecified trimester: Secondary | ICD-10-CM

## 2016-06-01 ENCOUNTER — Encounter: Payer: Medicaid Other | Admitting: Obstetrics and Gynecology

## 2016-06-04 ENCOUNTER — Ambulatory Visit (INDEPENDENT_AMBULATORY_CARE_PROVIDER_SITE_OTHER): Payer: Medicaid Other | Admitting: Obstetrics & Gynecology

## 2016-06-04 DIAGNOSIS — Z3482 Encounter for supervision of other normal pregnancy, second trimester: Secondary | ICD-10-CM

## 2016-06-04 DIAGNOSIS — O99342 Other mental disorders complicating pregnancy, second trimester: Secondary | ICD-10-CM

## 2016-06-04 DIAGNOSIS — Z3480 Encounter for supervision of other normal pregnancy, unspecified trimester: Secondary | ICD-10-CM

## 2016-06-04 DIAGNOSIS — O99340 Other mental disorders complicating pregnancy, unspecified trimester: Secondary | ICD-10-CM

## 2016-06-04 DIAGNOSIS — F419 Anxiety disorder, unspecified: Secondary | ICD-10-CM

## 2016-06-04 MED ORDER — SERTRALINE HCL 25 MG PO TABS: 25.0000 mg | ORAL_TABLET | Freq: Every day | ORAL | 1 refills | Status: DC

## 2016-06-04 NOTE — Patient Instructions (Signed)

## 2016-06-04 NOTE — Progress Notes (Signed)
   PRENATAL VISIT NOTE  Subjective:  Julie Maddox is a 21 y.o. G2P0010 at [redacted]w[redacted]d being seen today for ongoing prenatal care.  She is currently monitored for the following issues for this low-risk pregnancy and has Encounter for supervision of normal pregnancy, antepartum on her problem list.  Patient reports anxiety and obsessive behavior.  Contractions: Not present. Vag. Bleeding: None.  Movement: Present. Denies leaking of fluid.   The following portions of the patient's history were reviewed and updated as appropriate: allergies, current medications, past family history, past medical history, past social history, past surgical history and problem list. Problem list updated.  Objective:   Vitals:   06/04/16 0909  BP: 127/74  Pulse: 99  Weight: 154 lb 3.2 oz (69.9 kg)    Fetal Status: Fetal Heart Rate (bpm): 140 Fundal Height: 24 cm Movement: Present     General:  Alert, oriented and cooperative. Patient is in no acute distress.  Skin: Skin is warm and dry. No rash noted.   Cardiovascular: Normal heart rate noted  Respiratory: Normal respiratory effort, no problems with respiration noted  Abdomen: Soft, gravid, appropriate for gestational age. Pain/Pressure: Absent     Pelvic:  Cervical exam deferred        Extremities: Normal range of motion.  Edema: None  Mental Status: Normal mood and affect. Normal behavior. Normal judgment and thought content.   Assessment and Plan:  Pregnancy: G2P0010 at [redacted]w[redacted]d  1. Supervision of other normal pregnancy, antepartum Anxiety issues and h/o panic attacks - Ambulatory referral to [facility] - sertraline (ZOLOFT) 25 MG tablet; Take 1 tablet (25 mg total) by mouth daily.  Dispense: 30 tablet; Refill: 1  Preterm labor symptoms and general obstetric precautions including but not limited to vaginal bleeding, contractions, leaking of fluid and fetal movement were reviewed in detail with the patient. Please refer to After Visit  Summary for other counseling recommendations.  Return in about 4 weeks (around 07/02/2016) for 2 hr.   Emeterio Reeve, MD

## 2016-06-24 ENCOUNTER — Other Ambulatory Visit: Payer: Self-pay | Admitting: Obstetrics and Gynecology

## 2016-06-24 ENCOUNTER — Ambulatory Visit (HOSPITAL_COMMUNITY)
Admission: RE | Admit: 2016-06-24 | Discharge: 2016-06-24 | Disposition: A | Discharge status: Home / Self Care | Payer: Medicaid Other | Source: Ambulatory Visit | Attending: Obstetrics and Gynecology | Admitting: Obstetrics and Gynecology

## 2016-06-24 DIAGNOSIS — Z362 Encounter for other antenatal screening follow-up: Secondary | ICD-10-CM

## 2016-06-24 DIAGNOSIS — Z3A27 27 weeks gestation of pregnancy: Secondary | ICD-10-CM | POA: Insufficient documentation

## 2016-06-24 DIAGNOSIS — Z3480 Encounter for supervision of other normal pregnancy, unspecified trimester: Secondary | ICD-10-CM

## 2016-06-30 ENCOUNTER — Other Ambulatory Visit: Payer: Medicaid Other

## 2016-06-30 ENCOUNTER — Ambulatory Visit (INDEPENDENT_AMBULATORY_CARE_PROVIDER_SITE_OTHER): Payer: Medicaid Other | Admitting: Obstetrics & Gynecology

## 2016-06-30 ENCOUNTER — Other Ambulatory Visit (HOSPITAL_COMMUNITY)
Admission: RE | Admit: 2016-06-30 | Discharge: 2016-06-30 | Disposition: A | Discharge status: Home / Self Care | Payer: Medicaid Other | Source: Ambulatory Visit | Attending: Obstetrics & Gynecology | Admitting: Obstetrics & Gynecology

## 2016-06-30 VITALS — BP 111/71 | HR 70 | Wt 166.0 lb

## 2016-06-30 DIAGNOSIS — Z3483 Encounter for supervision of other normal pregnancy, third trimester: Secondary | ICD-10-CM

## 2016-06-30 DIAGNOSIS — N898 Other specified noninflammatory disorders of vagina: Secondary | ICD-10-CM | POA: Insufficient documentation

## 2016-06-30 DIAGNOSIS — O9989 Other specified diseases and conditions complicating pregnancy, childbirth and the puerperium: Secondary | ICD-10-CM

## 2016-06-30 DIAGNOSIS — Z3480 Encounter for supervision of other normal pregnancy, unspecified trimester: Secondary | ICD-10-CM

## 2016-06-30 NOTE — Progress Notes (Signed)
   PRENATAL VISIT NOTE  Subjective:  Julie Maddox is a 21 y.o. G2P0010 at [redacted]w[redacted]d being seen today for ongoing prenatal care.  She is currently monitored for the following issues for this low-risk pregnancy and has Encounter for supervision of normal pregnancy, antepartum and Anxiety disorder affecting pregnancy, antepartum on her problem list.  Patient reports wants to see counselor.  Contractions: Not present. Vag. Bleeding: None.  Movement: Present. Denies leaking of fluid.   The following portions of the patient's history were reviewed and updated as appropriate: allergies, current medications, past family history, past medical history, past social history, past surgical history and problem list. Problem list updated.  Objective:   Vitals:   06/30/16 0845  BP: 111/71  Pulse: 70  Weight: 166 lb (75.3 kg)    Fetal Status: Fetal Heart Rate (bpm): 145 Fundal Height: 28 cm Movement: Present     General:  Alert, oriented and cooperative. Patient is in no acute distress.  Skin: Skin is warm and dry. No rash noted.   Cardiovascular: Normal heart rate noted  Respiratory: Normal respiratory effort, no problems with respiration noted  Abdomen: Soft, gravid, appropriate for gestational age. Pain/Pressure: Present     Pelvic:  Cervical exam deferred        Extremities: Normal range of motion.  Edema: None  Mental Status: Normal mood and affect. Normal behavior. Normal judgment and thought content.   Assessment and Plan:  Pregnancy: G2P0010 at [redacted]w[redacted]d  1. Vaginal discharge  - Cervicovaginal ancillary only - Ambulatory referral to [facility] 2. Supervision of other normal pregnancy, antepartum  - Glucose Tolerance, 2 Hours w/1 Hour - CBC - HIV antibody - RPR - Ambulatory referral to [place]  Preterm labor symptoms and general obstetric precautions including but not limited to vaginal bleeding, contractions, leaking of fluid and  fetal movement were reviewed in detail with the patient. Please refer to After Visit Summary for other counseling recommendations.  Return in about 4 weeks (around 07/28/2016).   Emeterio Reeve, MD

## 2016-06-30 NOTE — Patient Instructions (Addendum)
Third Trimester of Pregnancy The third trimester is from week 28 through week 40 (months 7 through 9). The third trimester is a time when the unborn baby (fetus) is growing rapidly. At the end of the ninth month, the fetus is about 20 inches in length and weighs 6-10 pounds. Body changes during your third trimester Your body will continue to go through many changes during pregnancy. The changes vary from woman to woman. During the third trimester:  Your weight will continue to increase. You can expect to gain 25-35 pounds (11-16 kg) by the end of the pregnancy.  You may begin to get stretch marks on your hips, abdomen, and breasts.  You may urinate more often because the fetus is moving lower into your pelvis and pressing on your bladder.  You may develop or continue to have heartburn. This is caused by increased hormones that slow down muscles in the digestive tract.  You may develop or continue to have constipation because increased hormones slow digestion and cause the muscles that push waste through your intestines to relax.  You may develop hemorrhoids. These are swollen veins (varicose veins) in the rectum that can itch or be painful.  You may develop swollen, bulging veins (varicose veins) in your legs.  You may have increased body aches in the pelvis, back, or thighs. This is due to weight gain and increased hormones that are relaxing your joints.  You may have changes in your hair. These can include thickening of your hair, rapid growth, and changes in texture. Some women also have hair loss during or after pregnancy, or hair that feels dry or thin. Your hair will most likely return to normal after your baby is born.  Your breasts will continue to grow and they will continue to become tender. A yellow fluid (colostrum) may leak from your breasts. This is the first milk you are producing for your baby.  Your belly button may stick out.  You may notice more swelling in your hands,  face, or ankles.  You may have increased tingling or numbness in your hands, arms, and legs. The skin on your belly may also feel numb.  You may feel short of breath because of your expanding uterus.  You may have more problems sleeping. This can be caused by the size of your belly, increased need to urinate, and an increase in your body's metabolism.  You may notice the fetus "dropping," or moving lower in your abdomen (lightening).  You may have increased vaginal discharge.  You may notice your joints feel loose and you may have pain around your pelvic bone.  What to expect at prenatal visits You will have prenatal exams every 2 weeks until week 36. Then you will have weekly prenatal exams. During a routine prenatal visit:  You will be weighed to make sure you and the baby are growing normally.  Your blood pressure will be taken.  Your abdomen will be measured to track your baby's growth.  The fetal heartbeat will be listened to.  Any test results from the previous visit will be discussed.  You may have a cervical check near your due date to see if your cervix has softened or thinned (effaced).  You will be tested for Group B streptococcus. This happens between 35 and 37 weeks.  Your health care provider may ask you:  What your birth plan is.  How you are feeling.  If you are feeling the baby move.  If you have had   any abnormal symptoms, such as leaking fluid, bleeding, severe headaches, or abdominal cramping.  If you are using any tobacco products, including cigarettes, chewing tobacco, and electronic cigarettes.  If you have any questions.  Other tests or screenings that may be performed during your third trimester include:  Blood tests that check for low iron levels (anemia).  Fetal testing to check the health, activity level, and growth of the fetus. Testing is done if you have certain medical conditions or if there are problems during the  pregnancy.  Nonstress test (NST). This test checks the health of your baby to make sure there are no signs of problems, such as the baby not getting enough oxygen. During this test, a belt is placed around your belly. The baby is made to move, and its heart rate is monitored during movement.  What is false labor? False labor is a condition in which you feel small, irregular tightenings of the muscles in the womb (contractions) that usually go away with rest, changing position, or drinking water. These are called Braxton Hicks contractions. Contractions may last for hours, days, or even weeks before true labor sets in. If contractions come at regular intervals, become more frequent, increase in intensity, or become painful, you should see your health care provider. What are the signs of labor?  Abdominal cramps.  Regular contractions that start at 10 minutes apart and become stronger and more frequent with time.  Contractions that start on the top of the uterus and spread down to the lower abdomen and back.  Increased pelvic pressure and dull back pain.  A watery or bloody mucus discharge that comes from the vagina.  Leaking of amniotic fluid. This is also known as your "water breaking." It could be a slow trickle or a gush. Let your health care provider know if it has a color or strange odor. If you have any of these signs, call your health care provider right away, even if it is before your due date. Follow these instructions at home: Medicines  Follow your health care provider's instructions regarding medicine use. Specific medicines may be either safe or unsafe to take during pregnancy.  Take a prenatal vitamin that contains at least 600 micrograms (mcg) of folic acid.  If you develop constipation, try taking a stool softener if your health care provider approves. Eating and drinking  Eat a balanced diet that includes fresh fruits and vegetables, whole grains, good sources of protein  such as meat, eggs, or tofu, and low-fat dairy. Your health care provider will help you determine the amount of weight gain that is right for you.  Avoid raw meat and uncooked cheese. These carry germs that can cause birth defects in the baby.  If you have low calcium intake from food, talk to your health care provider about whether you should take a daily calcium supplement.  Eat four or five small meals rather than three large meals a day.  Limit foods that are high in fat and processed sugars, such as fried and sweet foods.  To prevent constipation: ? Drink enough fluid to keep your urine clear or pale yellow. ? Eat foods that are high in fiber, such as fresh fruits and vegetables, whole grains, and beans. Activity  Exercise only as directed by your health care provider. Most women can continue their usual exercise routine during pregnancy. Try to exercise for 30 minutes at least 5 days a week. Stop exercising if you experience uterine contractions.  Avoid heavy   lifting.  Do not exercise in extreme heat or humidity, or at high altitudes.  Wear low-heel, comfortable shoes.  Practice good posture.  You may continue to have sex unless your health care provider tells you otherwise. Relieving pain and discomfort  Take frequent breaks and rest with your legs elevated if you have leg cramps or low back pain.  Take warm sitz baths to soothe any pain or discomfort caused by hemorrhoids. Use hemorrhoid cream if your health care provider approves.  Wear a good support bra to prevent discomfort from breast tenderness.  If you develop varicose veins: ? Wear support pantyhose or compression stockings as told by your healthcare provider. ? Elevate your feet for 15 minutes, 3-4 times a day. Prenatal care  Write down your questions. Take them to your prenatal visits.  Keep all your prenatal visits as told by your health care provider. This is important. Safety  Wear your seat belt at  all times when driving.  Make a list of emergency phone numbers, including numbers for family, friends, the hospital, and police and fire departments. General instructions  Avoid cat litter boxes and soil used by cats. These carry germs that can cause birth defects in the baby. If you have a cat, ask someone to clean the litter box for you.  Do not travel far distances unless it is absolutely necessary and only with the approval of your health care provider.  Do not use hot tubs, steam rooms, or saunas.  Do not drink alcohol.  Do not use any products that contain nicotine or tobacco, such as cigarettes and e-cigarettes. If you need help quitting, ask your health care provider.  Do not use any medicinal herbs or unprescribed drugs. These chemicals affect the formation and growth of the baby.  Do not douche or use tampons or scented sanitary pads.  Do not cross your legs for long periods of time.  To prepare for the arrival of your baby: ? Take prenatal classes to understand, practice, and ask questions about labor and delivery. ? Make a trial run to the hospital. ? Visit the hospital and tour the maternity area. ? Arrange for maternity or paternity leave through employers. ? Arrange for family and friends to take care of pets while you are in the hospital. ? Purchase a rear-facing car seat and make sure you know how to install it in your car. ? Pack your hospital bag. ? Prepare the baby's nursery. Make sure to remove all pillows and stuffed animals from the baby's crib to prevent suffocation.  Visit your dentist if you have not gone during your pregnancy. Use a soft toothbrush to brush your teeth and be gentle when you floss. Contact a health care provider if:  You are unsure if you are in labor or if your water has broken.  You become dizzy.  You have mild pelvic cramps, pelvic pressure, or nagging pain in your abdominal area.  You have lower back pain.  You have persistent  nausea, vomiting, or diarrhea.  You have an unusual or bad smelling vaginal discharge.  You have pain when you urinate. Get help right away if:  Your water breaks before 37 weeks.  You have regular contractions less than 5 minutes apart before 37 weeks.  You have a fever.  You are leaking fluid from your vagina.  You have spotting or bleeding from your vagina.  You have severe abdominal pain or cramping.  You have rapid weight loss or weight gain.    You have shortness of breath with chest pain.  You notice sudden or extreme swelling of your face, hands, ankles, feet, or legs.  Your baby makes fewer than 10 movements in 2 hours.  You have severe headaches that do not go away when you take medicine.  You have vision changes. Summary  The third trimester is from week 28 through week 40, months 7 through 9. The third trimester is a time when the unborn baby (fetus) is growing rapidly.  During the third trimester, your discomfort may increase as you and your baby continue to gain weight. You may have abdominal, leg, and back pain, sleeping problems, and an increased need to urinate.  During the third trimester your breasts will keep growing and they will continue to become tender. A yellow fluid (colostrum) may leak from your breasts. This is the first milk you are producing for your baby.  False labor is a condition in which you feel small, irregular tightenings of the muscles in the womb (contractions) that eventually go away. These are called Braxton Hicks contractions. Contractions may last for hours, days, or even weeks before true labor sets in.  Signs of labor can include: abdominal cramps; regular contractions that start at 10 minutes apart and become stronger and more frequent with time; watery or bloody mucus discharge that comes from the vagina; increased pelvic pressure and dull back pain; and leaking of amniotic fluid. This information is not intended to replace advice  given to you by your health care provider. Make sure you discuss any questions you have with your health care provider. Document Released: 12/23/2000 Document Revised: 06/06/2015 Document Reviewed: 03/01/2012 Elsevier Interactive Patient Education  2017 [employer]  

## 2016-06-30 NOTE — Progress Notes (Signed)
Pt states that she is having a thick discharge. States that it looks like it has a greenish tint to it. Pt would like to be checked.  Pt also would like a referral to a counselor.

## 2016-07-01 LAB — GLUCOSE TOLERANCE, 2 HOURS W/ 1HR
GLUCOSE, 1 HOUR: 70 mg/dL (ref 65–179)
GLUCOSE, FASTING: 87 mg/dL (ref 65–91)
Glucose, 2 hour: 90 mg/dL (ref 65–152)

## 2016-07-01 LAB — CBC
Hematocrit: 35.5 % (ref 34.0–46.6)
Hemoglobin: 12.2 g/dL (ref 11.1–15.9)
MCH: 31.8 pg (ref 26.6–33.0)
MCHC: 34.4 g/dL (ref 31.5–35.7)
MCV: 92 fL (ref 79–97)
PLATELETS: 148 10*3/uL — AB (ref 150–379)
RBC: 3.84 x10E6/uL (ref 3.77–5.28)
RDW: 14.1 % (ref 12.3–15.4)
WBC: 7.9 10*3/uL (ref 3.4–10.8)

## 2016-07-01 LAB — CERVICOVAGINAL ANCILLARY ONLY
Bacterial vaginitis: NEGATIVE
CANDIDA VAGINITIS: POSITIVE — AB
CHLAMYDIA, DNA PROBE: NEGATIVE
NEISSERIA GONORRHEA: NEGATIVE
TRICH (WINDOWPATH): NEGATIVE

## 2016-07-01 LAB — RPR: RPR Ser Ql: NONREACTIVE

## 2016-07-01 LAB — HIV ANTIBODY (ROUTINE TESTING W REFLEX): HIV Screen 4th Generation wRfx: NONREACTIVE

## 2016-07-07 ENCOUNTER — Telehealth: Payer: Self-pay

## 2016-07-07 ENCOUNTER — Other Ambulatory Visit: Payer: Self-pay

## 2016-07-07 MED ORDER — FLUCONAZOLE 150 MG PO TABS: 150.0000 mg | ORAL_TABLET | Freq: Once | ORAL | 0 refills | Status: AC

## 2016-07-07 NOTE — Telephone Encounter (Signed)
Called patient regarding her yeast infection. No answer or voice mail to leave a message.

## 2016-07-07 NOTE — Telephone Encounter (Signed)
Patient has yeast infection. Diflucan 150 mg has been called into patient pharmacy.

## 2016-07-13 ENCOUNTER — Institutional Professional Consult (permissible substitution): Payer: Medicaid Other

## 2016-07-27 ENCOUNTER — Ambulatory Visit (INDEPENDENT_AMBULATORY_CARE_PROVIDER_SITE_OTHER): Payer: Medicaid Other | Admitting: Clinical

## 2016-07-27 ENCOUNTER — Ambulatory Visit (INDEPENDENT_AMBULATORY_CARE_PROVIDER_SITE_OTHER): Payer: Medicaid Other | Admitting: Obstetrics & Gynecology

## 2016-07-27 VITALS — BP 119/81 | HR 99 | Wt 171.2 lb

## 2016-07-27 DIAGNOSIS — O99340 Other mental disorders complicating pregnancy, unspecified trimester: Secondary | ICD-10-CM

## 2016-07-27 DIAGNOSIS — F4322 Adjustment disorder with anxiety: Secondary | ICD-10-CM

## 2016-07-27 DIAGNOSIS — O99343 Other mental disorders complicating pregnancy, third trimester: Secondary | ICD-10-CM

## 2016-07-27 DIAGNOSIS — F419 Anxiety disorder, unspecified: Secondary | ICD-10-CM

## 2016-07-27 DIAGNOSIS — Z3480 Encounter for supervision of other normal pregnancy, unspecified trimester: Secondary | ICD-10-CM

## 2016-07-27 DIAGNOSIS — Z23 Encounter for immunization: Secondary | ICD-10-CM | POA: Diagnosis not present

## 2016-07-27 DIAGNOSIS — Z3483 Encounter for supervision of other normal pregnancy, third trimester: Secondary | ICD-10-CM

## 2016-07-27 NOTE — Patient Instructions (Signed)
Return to clinic for any scheduled appointments or obstetric concerns, or go to [facility] for evaluation  

## 2016-07-27 NOTE — Progress Notes (Signed)
   PRENATAL VISIT NOTE  Subjective:  Julie Maddox is a 21 y.o. G2P0010 at [redacted]w[redacted]d being seen today for ongoing prenatal care.  She is currently monitored for the following issues for this low-risk pregnancy and has Encounter for supervision of normal pregnancy, antepartum and Anxiety disorder affecting pregnancy, antepartum on her problem list.  Patient reports no complaints.  Contractions: Not present. Vag. Bleeding: None.  Movement: Present. Denies leaking of fluid.   The following portions of the patient's history were reviewed and updated as appropriate: allergies, current medications, past family history, past medical history, past social history, past surgical history and problem list. Problem list updated.  Objective:   Vitals:   07/27/16 1418  BP: 119/81  Pulse: 99  Weight: 171 lb 3.2 oz (77.7 kg)    Fetal Status: Fetal Heart Rate (bpm): 144 Fundal Height: 32 cm Movement: Present     General:  Alert, oriented and cooperative. Patient is in no acute distress.  Skin: Skin is warm and dry. No rash noted.   Cardiovascular: Normal heart rate noted  Respiratory: Normal respiratory effort, no problems with respiration noted  Abdomen: Soft, gravid, appropriate for gestational age.  Pain/Pressure: Absent     Pelvic: Cervical exam deferred        Extremities: Normal range of motion.  Edema: None  Mental Status:  Normal mood and affect. Normal behavior. Normal judgment and thought content.   Assessment and Plan:  Pregnancy: G2P0010 at [redacted]w[redacted]d  1. Anxiety disorder affecting pregnancy, antepartum Went to meet with [facility] counselor, does not want to start Zoloft as she feels she does not need it.  Will continue to monitor.   2. Supervision of other normal pregnancy, antepartum Preterm labor symptoms and general obstetric precautions including but not limited to vaginal bleeding, contractions, leaking of fluid and fetal movement were reviewed in detail with the patient. Please refer to  After Visit Summary for other counseling recommendations.  Return in about 4 weeks (around 08/24/2016) for OB 36 week visit (Babyscripts), Pelvic cultures.   Verita Schneiders, MD

## 2016-07-27 NOTE — BH Specialist Note (Signed)
Integrated Behavioral Health Initial Visit  MRN: [id number] Name: Julie Maddox   Session Start time: 1:30 Session End time: 2:00 Total time: 30 minutes  Type of Service: [place] Interpretor:No. Interpretor Name and Language: n/a   Warm Hand Off Completed.       SUBJECTIVE: Julie Maddox is a 21 y.o. female accompanied by patient. Patient was referred by Dr Roselie Awkward for anxiety. Patient reports the following symptoms/concerns: Pt states that her primary concern is worry, irritability,anxiety, and family history of anxiety, along with stressful family events affecting sleep past week; pt used to cope by practicing meditation, but has gotten out of the habit. Duration of problem: Current pregnancy; Severity of problem: moderate  OBJECTIVE: Mood: Anxious and Affect: Appropriate Risk of harm to self or others: No plan to harm self or others   LIFE CONTEXT: Family and Social: Lives with mother for help after baby's arrival; family experienced shooting death of her 49yo brother when she was 69yo, along with current unidentified stressful life events in family(does not wish to discuss today) School/Work: - Self-Care: Used to meditate daily, walks twice/month Life Changes: Current pregnancy, family stress(unidentified, concerning cousin/nephew)  GOALS ADDRESSED: Patient will reduce symptoms of: anxiety and stress and increase knowledge and/or ability of: stress reduction and also: Increase healthy adjustment to current life circumstances and Increase adequate support systems for patient/family   INTERVENTIONS: Mindfulness or Relaxation Training, Psychoeducation and/or Health Education and Link to [employer]  Standardized Assessments completed: GAD-7 and PHQ 9  ASSESSMENT: Patient currently experiencing Adjustment disorder with anxiety. Patient may benefit from psychoeducation and brief therapeutic intervention regarding coping  with symptoms of anxiety.  PLAN: 1. Follow up with behavioral health clinician on : Two weeks 2. Behavioral recommendations:  -CALM relaxation breathing exercise every morning; throughout the day as needed -Use apps as additional self-coping strategies(for improved sleep and stress management) -Consider [facility] mom support classes postpartum -Consider community resources, as needed([place] [employer], [employer]) -Counsellor regarding coping with symptoms of anxiety, and Pospartum Planner 3. Referral(s): [place] (In Clinic) and [employer] Resources:  Housing 4. "From scale of 1-10, how likely are you to follow plan?": 9  Julie Maddox, LCSWA  Depression screen [facility] 2/9 07/27/2016  Decreased Interest 1  Down, Depressed, Hopeless 0  PHQ - 2 Score 1  Altered sleeping 1  Tired, decreased energy 0  Change in appetite 0  Feeling bad or failure about yourself  1  Trouble concentrating 1  Moving slowly or fidgety/restless 0  Suicidal thoughts 0  PHQ-9 Score 4   GAD 7 : Generalized Anxiety Score 07/27/2016  Nervous, Anxious, on Edge 2  Control/stop worrying 2  Worry too much - different things 3  Trouble relaxing 1  Restless 0  Easily annoyed or irritable 3  Afraid - awful might happen 1  Total GAD 7 Score 12

## 2016-08-18 ENCOUNTER — Ambulatory Visit (INDEPENDENT_AMBULATORY_CARE_PROVIDER_SITE_OTHER): Payer: Medicaid Other | Admitting: Obstetrics and Gynecology

## 2016-08-18 VITALS — BP 110/75 | HR 76 | Wt 180.6 lb

## 2016-08-18 DIAGNOSIS — D171 Benign lipomatous neoplasm of skin and subcutaneous tissue of trunk: Secondary | ICD-10-CM

## 2016-08-18 NOTE — Progress Notes (Signed)
Patient scheduled for ob appointment next week but came in after noticing a lump on her back on Sunday. She states that the lump just appeared. It is not painful and not draining. She doesn't remember it being smaller  Physical exam: palpable 5 cm soft tissue mass near right upper scapula. No erythema, induration or drainage. Non tender to touch  A/P 21 yo with physical findings suspicious for a lipoma - Will order a CT postpartum and refer to gen surgery for further management - Reassurance provided

## 2016-08-18 NOTE — Progress Notes (Signed)
Pt complains of having a lump on her back . Pt noticed this past Sunday. Denies any drainage, and pain. Pt also complains of having nausea, and spitting a lot. She has not vomited.

## 2016-08-24 ENCOUNTER — Ambulatory Visit (INDEPENDENT_AMBULATORY_CARE_PROVIDER_SITE_OTHER): Payer: Medicaid Other | Admitting: Obstetrics and Gynecology

## 2016-08-24 ENCOUNTER — Other Ambulatory Visit (HOSPITAL_COMMUNITY)
Admission: RE | Admit: 2016-08-24 | Discharge: 2016-08-24 | Disposition: A | Discharge status: Home / Self Care | Payer: Medicaid Other | Source: Ambulatory Visit | Attending: Obstetrics and Gynecology | Admitting: Obstetrics and Gynecology

## 2016-08-24 VITALS — BP 119/87 | HR 93 | Wt 181.8 lb

## 2016-08-24 DIAGNOSIS — Z3A00 Weeks of gestation of pregnancy not specified: Secondary | ICD-10-CM | POA: Diagnosis not present

## 2016-08-24 DIAGNOSIS — Z3480 Encounter for supervision of other normal pregnancy, unspecified trimester: Secondary | ICD-10-CM

## 2016-08-24 DIAGNOSIS — Z3483 Encounter for supervision of other normal pregnancy, third trimester: Secondary | ICD-10-CM

## 2016-08-24 LAB — OB RESULTS CONSOLE GBS: STREP GROUP B AG: NEGATIVE

## 2016-08-24 NOTE — Progress Notes (Signed)
   PRENATAL VISIT NOTE  Subjective:  Julie Maddox is a 21 y.o. G2P0010 at [redacted]w[redacted]d being seen today for ongoing prenatal care.  She is currently monitored for the following issues for this low-risk pregnancy and has Encounter for supervision of normal pregnancy, antepartum and Anxiety disorder affecting pregnancy, antepartum on her problem list.  Patient reports no complaints.  Contractions: Not present. Vag. Bleeding: None.  Movement: Present. Denies leaking of fluid.   The following portions of the patient's history were reviewed and updated as appropriate: allergies, current medications, past family history, past medical history, past social history, past surgical history and problem list. Problem list updated.  Objective:   Vitals:   08/24/16 1337  BP: 119/87  Pulse: 93  Weight: 181 lb 12.8 oz (82.5 kg)    Fetal Status: Fetal Heart Rate (bpm): 130 Fundal Height: 36 cm Movement: Present  Presentation: Vertex  General:  Alert, oriented and cooperative. Patient is in no acute distress.  Skin: Skin is warm and dry. No rash noted.   Cardiovascular: Normal heart rate noted  Respiratory: Normal respiratory effort, no problems with respiration noted  Abdomen: Soft, gravid, appropriate for gestational age.  Pain/Pressure: Absent     Pelvic: Cervical exam performed Dilation: Closed Effacement (%): Thick Station: -3  Extremities: Normal range of motion.  Edema: None  Mental Status:  Normal mood and affect. Normal behavior. Normal judgment and thought content.   Assessment and Plan:  Pregnancy: G2P0010 at [redacted]w[redacted]d  1. Supervision of other normal pregnancy, antepartum Patient is doing well without complaints Cultures today with CBC - Strep Gp B NAA - GC/Chlamydia probe amp ([place])not at [facility] - CBC  Preterm labor symptoms and general obstetric precautions including but not limited to vaginal bleeding, contractions, leaking of fluid and fetal movement were reviewed in detail with  the patient. Please refer to After Visit Summary for other counseling recommendations.  Return in about 2 weeks (around 09/07/2016) for ROB- BRx.   Mora Bellman, MD

## 2016-08-25 LAB — CBC
HEMOGLOBIN: 12 g/dL (ref 11.1–15.9)
Hematocrit: 34.2 % (ref 34.0–46.6)
MCH: 32 pg (ref 26.6–33.0)
MCHC: 35.1 g/dL (ref 31.5–35.7)
MCV: 91 fL (ref 79–97)
Platelets: 146 10*3/uL — ABNORMAL LOW (ref 150–379)
RBC: 3.75 x10E6/uL — ABNORMAL LOW (ref 3.77–5.28)
RDW: 13 % (ref 12.3–15.4)
WBC: 6.4 10*3/uL (ref 3.4–10.8)

## 2016-08-26 LAB — GC/CHLAMYDIA PROBE AMP (~~LOC~~) NOT AT ARMC
CHLAMYDIA, DNA PROBE: NEGATIVE
NEISSERIA GONORRHEA: NEGATIVE

## 2016-08-26 LAB — STREP GP B NAA: STREP GROUP B AG: NEGATIVE

## 2016-09-07 ENCOUNTER — Ambulatory Visit (INDEPENDENT_AMBULATORY_CARE_PROVIDER_SITE_OTHER): Payer: Medicaid Other | Admitting: Obstetrics and Gynecology

## 2016-09-07 VITALS — BP 121/80 | HR 87 | Wt 184.2 lb

## 2016-09-07 DIAGNOSIS — Z3480 Encounter for supervision of other normal pregnancy, unspecified trimester: Secondary | ICD-10-CM

## 2016-09-07 NOTE — Patient Instructions (Signed)
Vaginal Delivery Vaginal delivery means that you will give birth by pushing your baby out of your birth canal (vagina). A team of health care providers will help you before, during, and after vaginal delivery. Birth experiences are unique for every woman and every pregnancy, and birth experiences vary depending on where you choose to give birth. What should I do to prepare for my baby's birth? Before your baby is born, it is important to talk with your health care provider about:  Your labor and delivery preferences. These may include: ? Medicines that you may be given. ? How you will manage your pain. This might include non-medical pain relief techniques or injectable pain relief such as epidural analgesia. ? How you and your baby will be monitored during labor and delivery. ? Who may be in the labor and delivery room with you. ? Your feelings about surgical delivery of your baby (cesarean delivery, or C-section) if this becomes necessary. ? Your feelings about receiving donated blood through an IV tube (blood transfusion) if this becomes necessary.  Whether you are able: ? To take pictures or videos of the birth. ? To eat during labor and delivery. ? To move around, walk, or change positions during labor and delivery.  What to expect after your baby is born, such as: ? Whether delayed umbilical cord clamping and cutting is offered. ? Who will care for your baby right after birth. ? Medicines or tests that may be recommended for your baby. ? Whether breastfeeding is supported in your hospital or birth center. ? How long you will be in the hospital or birth center.  How any medical conditions you have may affect your baby or your labor and delivery experience.  To prepare for your baby's birth, you should also:  Attend all of your health care visits before delivery (prenatal visits) as recommended by your health care provider. This is important.  Prepare your home for your baby's  arrival. Make sure that you have: ? Diapers. ? Baby clothing. ? Feeding equipment. ? Safe sleeping arrangements for you and your baby.  Install a car seat in your vehicle. Have your car seat checked by a certified car seat installer to make sure that it is installed safely.  Think about who will help you with your new baby at home for at least the first several weeks after delivery.  What can I expect when I arrive at the birth center or hospital? Once you are in labor and have been admitted into the hospital or birth center, your health care provider may:  Review your pregnancy history and any concerns you have.  Insert an IV tube into one of your veins. This is used to give you fluids and medicines.  Check your blood pressure, pulse, temperature, and heart rate (vital signs).  Check whether your bag of water (amniotic sac) has broken (ruptured).  Talk with you about your birth plan and discuss pain control options.  Monitoring Your health care provider may monitor your contractions (uterine monitoring) and your baby's heart rate (fetal monitoring). You may need to be monitored:  Often, but not continuously (intermittently).  All the time or for long periods at a time (continuously). Continuous monitoring may be needed if: ? You are taking certain medicines, such as medicine to relieve pain or make your contractions stronger. ? You have pregnancy or labor complications.  Monitoring may be done by:  Placing a special stethoscope or a handheld monitoring device on your abdomen to   check your baby's heartbeat, and feeling your abdomen for contractions. This method of monitoring does not continuously record your baby's heartbeat or your contractions.  Placing monitors on your abdomen (external monitors) to record your baby's heartbeat and the frequency and length of contractions. You may not have to wear external monitors all the time.  Placing monitors inside of your uterus  (internal monitors) to record your baby's heartbeat and the frequency, length, and strength of your contractions. ? Your health care provider may use internal monitors if he or she needs more information about the strength of your contractions or your baby's heart rate. ? Internal monitors are put in place by passing a thin, flexible wire through your vagina and into your uterus. Depending on the type of monitor, it may remain in your uterus or on your baby's head until birth. ? Your health care provider will discuss the benefits and risks of internal monitoring with you and will ask for your permission before inserting the monitors.  Telemetry. This is a type of continuous monitoring that can be done with external or internal monitors. Instead of having to stay in bed, you are able to move around during telemetry. Ask your health care provider if telemetry is an option for you.  Physical exam Your health care provider may perform a physical exam. This may include:  Checking whether your baby is positioned: ? With the head toward your vagina (head-down). This is most common. ? With the head toward the top of your uterus (head-up or breech). If your baby is in a breech position, your health care provider may try to turn your baby to a head-down position so you can deliver vaginally. If it does not seem that your baby can be born vaginally, your provider may recommend surgery to deliver your baby. In rare cases, you may be able to deliver vaginally if your baby is head-up (breech delivery). ? Lying sideways (transverse). Babies that are lying sideways cannot be delivered vaginally.  Checking your cervix to determine: ? Whether it is thinning out (effacing). ? Whether it is opening up (dilating). ? How low your baby has moved into your birth canal.  What are the three stages of labor and delivery?  Normal labor and delivery is divided into the following three stages: Stage 1  Stage 1 is the  longest stage of labor, and it can last for hours or days. Stage 1 includes: ? Early labor. This is when contractions may be irregular, or regular and mild. Generally, early labor contractions are more than 10 minutes apart. ? Active labor. This is when contractions get longer, more regular, more frequent, and more intense. ? The transition phase. This is when contractions happen very close together, are very intense, and may last longer than during any other part of labor.  Contractions generally feel mild, infrequent, and irregular at first. They get stronger, more frequent (about every 2-3 minutes), and more regular as you progress from early labor through active labor and transition.  Many women progress through stage 1 naturally, but you may need help to continue making progress. If this happens, your health care provider may talk with you about: ? Rupturing your amniotic sac if it has not ruptured yet. ? Giving you medicine to help make your contractions stronger and more frequent.  Stage 1 ends when your cervix is completely dilated to 4 inches (10 cm) and completely effaced. This happens at the end of the transition phase. Stage 2  Once   your cervix is completely effaced and dilated to 4 inches (10 cm), you may start to feel an urge to push. It is common for the body to naturally take a rest before feeling the urge to push, especially if you received an epidural or certain other pain medicines. This rest period may last for up to 1-2 hours, depending on your unique labor experience.  During stage 2, contractions are generally less painful, because pushing helps relieve contraction pain. Instead of contraction pain, you may feel stretching and burning pain, especially when the widest part of your baby's head passes through the vaginal opening (crowning).  Your health care provider will closely monitor your pushing progress and your baby's progress through the vagina during stage 2.  Your  health care provider may massage the area of skin between your vaginal opening and anus (perineum) or apply warm compresses to your perineum. This helps it stretch as the baby's head starts to crown, which can help prevent perineal tearing. ? In some cases, an incision may be made in your perineum (episiotomy) to allow the baby to pass through the vaginal opening. An episiotomy helps to make the opening of the vagina larger to allow more room for the baby to fit through.  It is very important to breathe and focus so your health care provider can control the delivery of your baby's head. Your health care provider may have you decrease the intensity of your pushing, to help prevent perineal tearing.  After delivery of your baby's head, the shoulders and the rest of the body generally deliver very quickly and without difficulty.  Once your baby is delivered, the umbilical cord may be cut right away, or this may be delayed for 1-2 minutes, depending on your baby's health. This may vary among health care providers, hospitals, and birth centers.  If you and your baby are healthy enough, your baby may be placed on your chest or abdomen to help maintain the baby's temperature and to help you bond with each other. Some mothers and babies start breastfeeding at this time. Your health care team will dry your baby and help keep your baby warm during this time.  Your baby may need immediate care if he or she: ? Showed signs of distress during labor. ? Has a medical condition. ? Was born too early (prematurely). ? Had a bowel movement before birth (meconium). ? Shows signs of difficulty transitioning from being inside the uterus to being outside of the uterus. If you are planning to breastfeed, your health care team will help you begin a feeding. Stage 3  The third stage of labor starts immediately after the birth of your baby and ends after you deliver the placenta. The placenta is an organ that develops  during pregnancy to provide oxygen and nutrients to your baby in the womb.  Delivering the placenta may require some pushing, and you may have mild contractions. Breastfeeding can stimulate contractions to help you deliver the placenta.  After the placenta is delivered, your uterus should tighten (contract) and become firm. This helps to stop bleeding in your uterus. To help your uterus contract and to control bleeding, your health care provider may: ? Give you medicine by injection, through an IV tube, by mouth, or through your rectum (rectally). ? Massage your abdomen or perform a vaginal exam to remove any blood clots that are left in your uterus. ? Empty your bladder by placing a thin, flexible tube (catheter) into your bladder. ? Encourage   you to breastfeed your baby. After labor is over, you and your baby will be monitored closely to ensure that you are both healthy until you are ready to go home. Your health care team will teach you how to care for yourself and your baby. This information is not intended to replace advice given to you by your health care provider. Make sure you discuss any questions you have with your health care provider. Document Released: 10/08/2007 Document Revised: 07/19/2015 Document Reviewed: 01/13/2015 Elsevier Interactive Patient Education  2018 [employer]  

## 2016-09-07 NOTE — Progress Notes (Signed)
Subjective:  Julie Maddox is a 21 y.o. G2P0010 at [redacted]w[redacted]d being seen today for ongoing prenatal care.  She is currently monitored for the following issues for this low-risk pregnancy and has Encounter for supervision of normal pregnancy, antepartum and Anxiety disorder affecting pregnancy, antepartum on her problem list.  Patient reports no complaints.  Contractions: Not present. Vag. Bleeding: None.  Movement: Present. Denies leaking of fluid.   The following portions of the patient's history were reviewed and updated as appropriate: allergies, current medications, past family history, past medical history, past social history, past surgical history and problem list. Problem list updated.  Objective:   Vitals:   09/07/16 1527  BP: 121/80  Pulse: 87  Weight: 184 lb 3.2 oz (83.6 kg)    Fetal Status: Fetal Heart Rate (bpm): 128   Movement: Present     General:  Alert, oriented and cooperative. Patient is in no acute distress.  Skin: Skin is warm and dry. No rash noted.   Cardiovascular: Normal heart rate noted  Respiratory: Normal respiratory effort, no problems with respiration noted  Abdomen: Soft, gravid, appropriate for gestational age. Pain/Pressure: Present     Pelvic:  Cervical exam performed        Extremities: Normal range of motion.  Edema: None  Mental Status: Normal mood and affect. Normal behavior. Normal judgment and thought content.   Urinalysis:      Assessment and Plan:  Pregnancy: G2P0010 at [redacted]w[redacted]d  1. Supervision of other normal pregnancy, antepartum Stable Labor precautions  Term labor symptoms and general obstetric precautions including but not limited to vaginal bleeding, contractions, leaking of fluid and fetal movement were reviewed in detail with the patient. Please refer to After Visit Summary for other counseling recommendations.  Return in about 1 week (around 09/14/2016) for OB visit.   Chancy Milroy, MD

## 2016-09-15 ENCOUNTER — Ambulatory Visit (INDEPENDENT_AMBULATORY_CARE_PROVIDER_SITE_OTHER): Payer: Medicaid Other | Admitting: Obstetrics & Gynecology

## 2016-09-15 DIAGNOSIS — Z3480 Encounter for supervision of other normal pregnancy, unspecified trimester: Secondary | ICD-10-CM

## 2016-09-15 NOTE — Progress Notes (Signed)
   PRENATAL VISIT NOTE  Subjective:  Julie Maddox is a 21 y.o. G2P0010 at [redacted]w[redacted]d being seen today for ongoing prenatal care.  She is currently monitored for the following issues for this low-risk pregnancy and has Encounter for supervision of normal pregnancy, antepartum and Anxiety disorder affecting pregnancy, antepartum on her problem list.  Patient reports no complaints.  Contractions: Not present. Vag. Bleeding: None.  Movement: Present. Denies leaking of fluid.   The following portions of the patient's history were reviewed and updated as appropriate: allergies, current medications, past family history, past medical history, past social history, past surgical history and problem list. Problem list updated.  Objective:   Vitals:   09/15/16 1435  BP: 124/86  Pulse: 84  Weight: 186 lb 9.6 oz (84.6 kg)    Fetal Status: Fetal Heart Rate (bpm): 140   Movement: Present     General:  Alert, oriented and cooperative. Patient is in no acute distress.  Skin: Skin is warm and dry. No rash noted.   Cardiovascular: Normal heart rate noted  Respiratory: Normal respiratory effort, no problems with respiration noted  Abdomen: Soft, gravid, appropriate for gestational age.  Pain/Pressure: Absent     Pelvic: Cervical exam performed        Extremities: Normal range of motion.  Edema: None  Mental Status:  Normal mood and affect. Normal behavior. Normal judgment and thought content.   Assessment and Plan:  Pregnancy: G2P0010 at [redacted]w[redacted]d  1. Supervision of other normal pregnancy, antepartum Doing well  Term labor symptoms and general obstetric precautions including but not limited to vaginal bleeding, contractions, leaking of fluid and fetal movement were reviewed in detail with the patient. Please refer to After Visit Summary for other counseling recommendations.  Return in about 1 week (around 09/22/2016).   Emeterio Reeve, MD

## 2016-09-15 NOTE — Patient Instructions (Signed)
Vaginal Delivery Vaginal delivery means that you will give birth by pushing your baby out of your birth canal (vagina). A team of health care providers will help you before, during, and after vaginal delivery. Birth experiences are unique for every woman and every pregnancy, and birth experiences vary depending on where you choose to give birth. What should I do to prepare for my baby's birth? Before your baby is born, it is important to talk with your health care provider about:  Your labor and delivery preferences. These may include: ? Medicines that you may be given. ? How you will manage your pain. This might include non-medical pain relief techniques or injectable pain relief such as epidural analgesia. ? How you and your baby will be monitored during labor and delivery. ? Who may be in the labor and delivery room with you. ? Your feelings about surgical delivery of your baby (cesarean delivery, or C-section) if this becomes necessary. ? Your feelings about receiving donated blood through an IV tube (blood transfusion) if this becomes necessary.  Whether you are able: ? To take pictures or videos of the birth. ? To eat during labor and delivery. ? To move around, walk, or change positions during labor and delivery.  What to expect after your baby is born, such as: ? Whether delayed umbilical cord clamping and cutting is offered. ? Who will care for your baby right after birth. ? Medicines or tests that may be recommended for your baby. ? Whether breastfeeding is supported in your hospital or birth center. ? How long you will be in the hospital or birth center.  How any medical conditions you have may affect your baby or your labor and delivery experience.  To prepare for your baby's birth, you should also:  Attend all of your health care visits before delivery (prenatal visits) as recommended by your health care provider. This is important.  Prepare your home for your baby's  arrival. Make sure that you have: ? Diapers. ? Baby clothing. ? Feeding equipment. ? Safe sleeping arrangements for you and your baby.  Install a car seat in your vehicle. Have your car seat checked by a certified car seat installer to make sure that it is installed safely.  Think about who will help you with your new baby at home for at least the first several weeks after delivery.  What can I expect when I arrive at the birth center or hospital? Once you are in labor and have been admitted into the hospital or birth center, your health care provider may:  Review your pregnancy history and any concerns you have.  Insert an IV tube into one of your veins. This is used to give you fluids and medicines.  Check your blood pressure, pulse, temperature, and heart rate (vital signs).  Check whether your bag of water (amniotic sac) has broken (ruptured).  Talk with you about your birth plan and discuss pain control options.  Monitoring Your health care provider may monitor your contractions (uterine monitoring) and your baby's heart rate (fetal monitoring). You may need to be monitored:  Often, but not continuously (intermittently).  All the time or for long periods at a time (continuously). Continuous monitoring may be needed if: ? You are taking certain medicines, such as medicine to relieve pain or make your contractions stronger. ? You have pregnancy or labor complications.  Monitoring may be done by:  Placing a special stethoscope or a handheld monitoring device on your abdomen to   check your baby's heartbeat, and feeling your abdomen for contractions. This method of monitoring does not continuously record your baby's heartbeat or your contractions.  Placing monitors on your abdomen (external monitors) to record your baby's heartbeat and the frequency and length of contractions. You may not have to wear external monitors all the time.  Placing monitors inside of your uterus  (internal monitors) to record your baby's heartbeat and the frequency, length, and strength of your contractions. ? Your health care provider may use internal monitors if he or she needs more information about the strength of your contractions or your baby's heart rate. ? Internal monitors are put in place by passing a thin, flexible wire through your vagina and into your uterus. Depending on the type of monitor, it may remain in your uterus or on your baby's head until birth. ? Your health care provider will discuss the benefits and risks of internal monitoring with you and will ask for your permission before inserting the monitors.  Telemetry. This is a type of continuous monitoring that can be done with external or internal monitors. Instead of having to stay in bed, you are able to move around during telemetry. Ask your health care provider if telemetry is an option for you.  Physical exam Your health care provider may perform a physical exam. This may include:  Checking whether your baby is positioned: ? With the head toward your vagina (head-down). This is most common. ? With the head toward the top of your uterus (head-up or breech). If your baby is in a breech position, your health care provider may try to turn your baby to a head-down position so you can deliver vaginally. If it does not seem that your baby can be born vaginally, your provider may recommend surgery to deliver your baby. In rare cases, you may be able to deliver vaginally if your baby is head-up (breech delivery). ? Lying sideways (transverse). Babies that are lying sideways cannot be delivered vaginally.  Checking your cervix to determine: ? Whether it is thinning out (effacing). ? Whether it is opening up (dilating). ? How low your baby has moved into your birth canal.  What are the three stages of labor and delivery?  Normal labor and delivery is divided into the following three stages: Stage 1  Stage 1 is the  longest stage of labor, and it can last for hours or days. Stage 1 includes: ? Early labor. This is when contractions may be irregular, or regular and mild. Generally, early labor contractions are more than 10 minutes apart. ? Active labor. This is when contractions get longer, more regular, more frequent, and more intense. ? The transition phase. This is when contractions happen very close together, are very intense, and may last longer than during any other part of labor.  Contractions generally feel mild, infrequent, and irregular at first. They get stronger, more frequent (about every 2-3 minutes), and more regular as you progress from early labor through active labor and transition.  Many women progress through stage 1 naturally, but you may need help to continue making progress. If this happens, your health care provider may talk with you about: ? Rupturing your amniotic sac if it has not ruptured yet. ? Giving you medicine to help make your contractions stronger and more frequent.  Stage 1 ends when your cervix is completely dilated to 4 inches (10 cm) and completely effaced. This happens at the end of the transition phase. Stage 2  Once   your cervix is completely effaced and dilated to 4 inches (10 cm), you may start to feel an urge to push. It is common for the body to naturally take a rest before feeling the urge to push, especially if you received an epidural or certain other pain medicines. This rest period may last for up to 1-2 hours, depending on your unique labor experience.  During stage 2, contractions are generally less painful, because pushing helps relieve contraction pain. Instead of contraction pain, you may feel stretching and burning pain, especially when the widest part of your baby's head passes through the vaginal opening (crowning).  Your health care provider will closely monitor your pushing progress and your baby's progress through the vagina during stage 2.  Your  health care provider may massage the area of skin between your vaginal opening and anus (perineum) or apply warm compresses to your perineum. This helps it stretch as the baby's head starts to crown, which can help prevent perineal tearing. ? In some cases, an incision may be made in your perineum (episiotomy) to allow the baby to pass through the vaginal opening. An episiotomy helps to make the opening of the vagina larger to allow more room for the baby to fit through.  It is very important to breathe and focus so your health care provider can control the delivery of your baby's head. Your health care provider may have you decrease the intensity of your pushing, to help prevent perineal tearing.  After delivery of your baby's head, the shoulders and the rest of the body generally deliver very quickly and without difficulty.  Once your baby is delivered, the umbilical cord may be cut right away, or this may be delayed for 1-2 minutes, depending on your baby's health. This may vary among health care providers, hospitals, and birth centers.  If you and your baby are healthy enough, your baby may be placed on your chest or abdomen to help maintain the baby's temperature and to help you bond with each other. Some mothers and babies start breastfeeding at this time. Your health care team will dry your baby and help keep your baby warm during this time.  Your baby may need immediate care if he or she: ? Showed signs of distress during labor. ? Has a medical condition. ? Was born too early (prematurely). ? Had a bowel movement before birth (meconium). ? Shows signs of difficulty transitioning from being inside the uterus to being outside of the uterus. If you are planning to breastfeed, your health care team will help you begin a feeding. Stage 3  The third stage of labor starts immediately after the birth of your baby and ends after you deliver the placenta. The placenta is an organ that develops  during pregnancy to provide oxygen and nutrients to your baby in the womb.  Delivering the placenta may require some pushing, and you may have mild contractions. Breastfeeding can stimulate contractions to help you deliver the placenta.  After the placenta is delivered, your uterus should tighten (contract) and become firm. This helps to stop bleeding in your uterus. To help your uterus contract and to control bleeding, your health care provider may: ? Give you medicine by injection, through an IV tube, by mouth, or through your rectum (rectally). ? Massage your abdomen or perform a vaginal exam to remove any blood clots that are left in your uterus. ? Empty your bladder by placing a thin, flexible tube (catheter) into your bladder. ? Encourage   you to breastfeed your baby. After labor is over, you and your baby will be monitored closely to ensure that you are both healthy until you are ready to go home. Your health care team will teach you how to care for yourself and your baby. This information is not intended to replace advice given to you by your health care provider. Make sure you discuss any questions you have with your health care provider. Document Released: 10/08/2007 Document Revised: 07/19/2015 Document Reviewed: 01/13/2015 Elsevier Interactive Patient Education  2018 [employer]  

## 2016-09-21 ENCOUNTER — Ambulatory Visit (INDEPENDENT_AMBULATORY_CARE_PROVIDER_SITE_OTHER): Payer: Medicaid Other | Admitting: Obstetrics and Gynecology

## 2016-09-21 VITALS — BP 129/90 | HR 85 | Wt 190.2 lb

## 2016-09-21 DIAGNOSIS — Z3480 Encounter for supervision of other normal pregnancy, unspecified trimester: Secondary | ICD-10-CM

## 2016-09-21 NOTE — Progress Notes (Signed)
   PRENATAL VISIT NOTE  Subjective:  Julie Maddox is a 21 y.o. G2P0010 at [redacted]w[redacted]d being seen today for ongoing prenatal care.  She is currently monitored for the following issues for this low-risk pregnancy and has Encounter for supervision of normal pregnancy, antepartum and Anxiety disorder affecting pregnancy, antepartum on her problem list.  Patient reports no complaints.  Contractions: Not present. Vag. Bleeding: None.  Movement: Present. Denies leaking of fluid.   The following portions of the patient's history were reviewed and updated as appropriate: allergies, current medications, past family history, past medical history, past social history, past surgical history and problem list. Problem list updated.  Objective:   Vitals:   09/21/16 1326  BP: 129/90  Pulse: 85  Weight: 190 lb 3.2 oz (86.3 kg)    Fetal Status: Fetal Heart Rate (bpm): 130 Fundal Height: 39 cm Movement: Present  Presentation: Vertex  General:  Alert, oriented and cooperative. Patient is in no acute distress.  Skin: Skin is warm and dry. No rash noted.   Cardiovascular: Normal heart rate noted  Respiratory: Normal respiratory effort, no problems with respiration noted  Abdomen: Soft, gravid, appropriate for gestational age.  Pain/Pressure: Absent     Pelvic: Cervical exam performed Dilation: Fingertip Effacement (%): 50 Station: -3  Extremities: Normal range of motion.  Edema: None  Mental Status:  Normal mood and affect. Normal behavior. Normal judgment and thought content.   Assessment and Plan:  Pregnancy: G2P0010 at [redacted]w[redacted]d  1. Supervision of other normal pregnancy, antepartum Patient is doing well Postdate induction scheduled on 9/17 Postdate fetal testing on Wednesday- thursday  Term labor symptoms and general obstetric precautions including but not limited to vaginal bleeding, contractions, leaking of fluid and fetal movement were reviewed in detail with the patient. Please refer to After Visit  Summary for other counseling recommendations.  Return in about 2 days (around 09/23/2016) for NST, AFI.   Mora Bellman, MD

## 2016-09-22 ENCOUNTER — Encounter: Payer: Self-pay | Admitting: Obstetrics and Gynecology

## 2016-09-23 ENCOUNTER — Encounter (HOSPITAL_COMMUNITY): Payer: Self-pay | Admitting: *Deleted

## 2016-09-23 ENCOUNTER — Inpatient Hospital Stay (HOSPITAL_COMMUNITY): Payer: Medicaid Other | Admitting: Anesthesiology

## 2016-09-23 ENCOUNTER — Inpatient Hospital Stay (HOSPITAL_COMMUNITY)
Admission: AD | Admit: 2016-09-23 | Discharge: 2016-09-25 | DRG: 775 | Disposition: A | Discharge status: Home / Self Care | Payer: Medicaid Other | Source: Ambulatory Visit | Attending: Family Medicine | Admitting: Family Medicine

## 2016-09-23 ENCOUNTER — Other Ambulatory Visit: Payer: Self-pay

## 2016-09-23 DIAGNOSIS — F419 Anxiety disorder, unspecified: Secondary | ICD-10-CM | POA: Diagnosis present

## 2016-09-23 DIAGNOSIS — Z3A40 40 weeks gestation of pregnancy: Secondary | ICD-10-CM

## 2016-09-23 DIAGNOSIS — O99344 Other mental disorders complicating childbirth: Secondary | ICD-10-CM | POA: Diagnosis present

## 2016-09-23 DIAGNOSIS — O99340 Other mental disorders complicating pregnancy, unspecified trimester: Secondary | ICD-10-CM

## 2016-09-23 DIAGNOSIS — Z3493 Encounter for supervision of normal pregnancy, unspecified, third trimester: Secondary | ICD-10-CM | POA: Diagnosis present

## 2016-09-23 DIAGNOSIS — Z880 Allergy status to penicillin: Secondary | ICD-10-CM | POA: Diagnosis not present

## 2016-09-23 DIAGNOSIS — Z3490 Encounter for supervision of normal pregnancy, unspecified, unspecified trimester: Secondary | ICD-10-CM

## 2016-09-23 LAB — TYPE AND SCREEN
ABO/RH(D): A POS
Antibody Screen: NEGATIVE

## 2016-09-23 LAB — CBC
HCT: 36.9 % (ref 36.0–46.0)
HEMOGLOBIN: 13.2 g/dL (ref 12.0–15.0)
MCH: 31.7 pg (ref 26.0–34.0)
MCHC: 35.8 g/dL (ref 30.0–36.0)
MCV: 88.7 fL (ref 78.0–100.0)
PLATELETS: 153 10*3/uL (ref 150–400)
RBC: 4.16 MIL/uL (ref 3.87–5.11)
RDW: 13.5 % (ref 11.5–15.5)
WBC: 8.7 10*3/uL (ref 4.0–10.5)

## 2016-09-23 LAB — ABO/RH: ABO/RH(D): A POS

## 2016-09-23 LAB — RPR: RPR Ser Ql: NONREACTIVE

## 2016-09-23 MED ORDER — ONDANSETRON HCL 4 MG/2ML IJ SOLN: 4.0000 mg | INTRAMUSCULAR | Status: DC | PRN

## 2016-09-23 MED ORDER — OXYCODONE-ACETAMINOPHEN 5-325 MG PO TABS: 1.0000 | ORAL_TABLET | ORAL | Status: DC | PRN

## 2016-09-23 MED ORDER — SIMETHICONE 80 MG PO CHEW
80.0000 mg | CHEWABLE_TABLET | ORAL | Status: DC | PRN
  Administered 2016-09-24: 80 mg via ORAL

## 2016-09-23 MED ORDER — LACTATED RINGERS IV SOLN
INTRAVENOUS | Status: DC
  Administered 2016-09-23 (×2): via INTRAVENOUS

## 2016-09-23 MED ORDER — ZOLPIDEM TARTRATE 5 MG PO TABS: 5.0000 mg | ORAL_TABLET | Freq: Every evening | ORAL | Status: DC | PRN

## 2016-09-23 MED ORDER — LIDOCAINE HCL (PF) 1 % IJ SOLN
30.0000 mL | INTRAMUSCULAR | Status: AC | PRN
  Administered 2016-09-23: 30 mL via SUBCUTANEOUS
  Filled 2016-09-23: qty 30

## 2016-09-23 MED ORDER — PHENYLEPHRINE 40 MCG/ML (10ML) SYRINGE FOR IV PUSH (FOR BLOOD PRESSURE SUPPORT)
80.0000 ug | PREFILLED_SYRINGE | INTRAVENOUS | Status: DC | PRN
  Filled 2016-09-23: qty 5

## 2016-09-23 MED ORDER — DIPHENHYDRAMINE HCL 50 MG/ML IJ SOLN: 12.5000 mg | INTRAMUSCULAR | Status: DC | PRN

## 2016-09-23 MED ORDER — COCONUT OIL OIL: 1.0000 "application " | TOPICAL_OIL | Status: DC | PRN

## 2016-09-23 MED ORDER — TETANUS-DIPHTH-ACELL PERTUSSIS 5-2.5-18.5 LF-MCG/0.5 IM SUSP
0.5000 mL | Freq: Once | INTRAMUSCULAR | Status: AC
  Administered 2016-09-25: 0.5 mL via INTRAMUSCULAR

## 2016-09-23 MED ORDER — OXYTOCIN 40 UNITS IN LACTATED RINGERS INFUSION - SIMPLE MED
2.5000 [IU]/h | INTRAVENOUS | Status: DC
  Filled 2016-09-23: qty 1000

## 2016-09-23 MED ORDER — SOD CITRATE-CITRIC ACID 500-334 MG/5ML PO SOLN: 30.0000 mL | ORAL | Status: DC | PRN

## 2016-09-23 MED ORDER — ONDANSETRON HCL 4 MG/2ML IJ SOLN: 4.0000 mg | Freq: Four times a day (QID) | INTRAMUSCULAR | Status: DC | PRN

## 2016-09-23 MED ORDER — FENTANYL CITRATE (PF) 100 MCG/2ML IJ SOLN
100.0000 ug | INTRAMUSCULAR | Status: DC | PRN
  Administered 2016-09-23 (×2): 100 ug via INTRAVENOUS
  Filled 2016-09-23 (×2): qty 2

## 2016-09-23 MED ORDER — OXYTOCIN BOLUS FROM INFUSION
500.0000 mL | Freq: Once | INTRAVENOUS | Status: AC
  Administered 2016-09-23: 500 mL via INTRAVENOUS

## 2016-09-23 MED ORDER — LACTATED RINGERS IV SOLN
500.0000 mL | INTRAVENOUS | Status: DC | PRN
  Administered 2016-09-23: 500 mL via INTRAVENOUS

## 2016-09-23 MED ORDER — LIDOCAINE HCL (PF) 1 % IJ SOLN
INTRAMUSCULAR | Status: DC | PRN
  Administered 2016-09-23 (×2): 5 mL via EPIDURAL

## 2016-09-23 MED ORDER — OXYCODONE-ACETAMINOPHEN 5-325 MG PO TABS: 2.0000 | ORAL_TABLET | ORAL | Status: DC | PRN

## 2016-09-23 MED ORDER — LACTATED RINGERS IV SOLN
500.0000 mL | Freq: Once | INTRAVENOUS | Status: AC
  Administered 2016-09-23: 500 mL via INTRAVENOUS

## 2016-09-23 MED ORDER — EPHEDRINE 5 MG/ML INJ
10.0000 mg | INTRAVENOUS | Status: DC | PRN
  Filled 2016-09-23: qty 2

## 2016-09-23 MED ORDER — FENTANYL 2.5 MCG/ML BUPIVACAINE 1/10 % EPIDURAL INFUSION (WH - ANES)
14.0000 mL/h | INTRAMUSCULAR | Status: DC | PRN
  Administered 2016-09-23: 14 mL/h via EPIDURAL
  Filled 2016-09-23: qty 100

## 2016-09-23 MED ORDER — PRENATAL MULTIVITAMIN CH
1.0000 | ORAL_TABLET | Freq: Every day | ORAL | Status: DC
  Administered 2016-09-24: 1 via ORAL
  Filled 2016-09-23 (×2): qty 1

## 2016-09-23 MED ORDER — DIPHENHYDRAMINE HCL 25 MG PO CAPS: 25.0000 mg | ORAL_CAPSULE | Freq: Four times a day (QID) | ORAL | Status: DC | PRN

## 2016-09-23 MED ORDER — ONDANSETRON HCL 4 MG PO TABS: 4.0000 mg | ORAL_TABLET | ORAL | Status: DC | PRN

## 2016-09-23 MED ORDER — WITCH HAZEL-GLYCERIN EX PADS
1.0000 | MEDICATED_PAD | CUTANEOUS | Status: DC | PRN
Start: 2016-09-23 — End: 2016-09-25

## 2016-09-23 MED ORDER — IBUPROFEN 600 MG PO TABS
600.0000 mg | ORAL_TABLET | Freq: Four times a day (QID) | ORAL | Status: DC
  Administered 2016-09-24 – 2016-09-25 (×6): 600 mg via ORAL
  Filled 2016-09-23 (×7): qty 1

## 2016-09-23 MED ORDER — SENNOSIDES-DOCUSATE SODIUM 8.6-50 MG PO TABS
2.0000 | ORAL_TABLET | ORAL | Status: DC
  Administered 2016-09-24 (×2): 2 via ORAL
  Filled 2016-09-23 (×2): qty 2

## 2016-09-23 MED ORDER — ACETAMINOPHEN 325 MG PO TABS: 650.0000 mg | ORAL_TABLET | ORAL | Status: DC | PRN

## 2016-09-23 MED ORDER — BENZOCAINE-MENTHOL 20-0.5 % EX AERO
1.0000 "application " | INHALATION_SPRAY | CUTANEOUS | Status: DC | PRN
  Filled 2016-09-23: qty 56

## 2016-09-23 MED ORDER — ACETAMINOPHEN 325 MG PO TABS
650.0000 mg | ORAL_TABLET | ORAL | Status: DC | PRN
  Administered 2016-09-24: 650 mg via ORAL
  Filled 2016-09-23: qty 2

## 2016-09-23 MED ORDER — DIBUCAINE 1 % RE OINT: 1.0000 "application " | TOPICAL_OINTMENT | RECTAL | Status: DC | PRN

## 2016-09-23 MED ORDER — PHENYLEPHRINE 40 MCG/ML (10ML) SYRINGE FOR IV PUSH (FOR BLOOD PRESSURE SUPPORT)
80.0000 ug | PREFILLED_SYRINGE | INTRAVENOUS | Status: DC | PRN
  Filled 2016-09-23: qty 5
  Filled 2016-09-23: qty 10

## 2016-09-23 NOTE — Anesthesia Procedure Notes (Signed)
Epidural Patient location during procedure: OB Start time: 09/23/2016 11:48 AM End time: 09/23/2016 11:59 AM  Staffing Anesthesiologist: Duane Boston Performed: anesthesiologist   Preanesthetic Checklist Completed: patient identified, site marked, pre-op evaluation, timeout performed, IV checked, risks and benefits discussed and monitors and equipment checked  Epidural Patient position: sitting Prep: DuraPrep Patient monitoring: heart rate, cardiac monitor, continuous pulse ox and blood pressure Approach: midline Location: L2-L3 Injection technique: LOR saline  Needle:  Needle type: Tuohy  Needle gauge: 17 G Needle length: 9 cm Needle insertion depth: 6 cm Catheter size: 20 Guage Catheter at skin depth: 11 cm Test dose: negative and Other  Assessment Events: blood not aspirated, injection not painful, no injection resistance and negative IV test  Additional Notes Informed consent obtained prior to proceeding including risk of failure, 1% risk of PDPH, risk of minor discomfort and bruising.  Discussed rare but serious complications including epidural abscess, permanent nerve injury, epidural hematoma.  Discussed alternatives to epidural analgesia and patient desires to proceed.  Timeout performed pre-procedure verifying patient name, procedure, and platelet count.  Patient tolerated procedure well.

## 2016-09-23 NOTE — Anesthesia Postprocedure Evaluation (Signed)
Anesthesia Post Note  Patient: Julie Maddox  Procedure(s) Performed: * No procedures listed *     Patient location during evaluation: Mother Baby Anesthesia Type: General Level of consciousness: awake and alert and oriented Pain management: satisfactory to patient Vital Signs Assessment: post-procedure vital signs reviewed and stable Respiratory status: spontaneous breathing and nonlabored ventilation Cardiovascular status: stable Postop Assessment: no headache, no backache, no signs of nausea or vomiting, adequate PO intake and patient able to bend at knees (patient up walking) Anesthetic complications: no    Last Vitals:  Vitals:   09/23/16 1847 09/23/16 1925  BP: 121/69 123/80  Pulse: 97 (!) 105  Resp: 18 20  Temp:  36.9 C  SpO2:      Last Pain:  Vitals:   09/23/16 1925  TempSrc: Oral  PainSc:    Pain Goal:                 Willa Rough

## 2016-09-23 NOTE — Progress Notes (Signed)
SVE 6-7cm/90% FHT 120, mod variability +acel, early decels, Cat I  Continue expectant management. Anticipate SVD  Jenne Pane. Corynne Scibilia, MD OB Fellow

## 2016-09-23 NOTE — Progress Notes (Signed)
Labor Progress Note Julie Maddox is a 21 y.o. G2P0010 at [redacted]w[redacted]d presented for labor.  S: patient now stating she has had some mucous discharge since last night at 2200. Comfortable using breathing and IV pain meds during contractions.  O:  BP 126/84   Pulse 71   Temp 98.2 F (36.8 C) (Oral)   Resp 18   Ht 5\' 7"  (1.702 m)   Wt 86.2 kg (190 lb)   LMP 12/16/2015 (Exact Date)   BMI 29.76 kg/m   10:45 CVE: Dilation: 6.5 Effacement (%): 90 Station: -1 Presentation: Vertex Exam by:: J.Thornton, RN   Membranes palpable   A&P: 21 y.o. G2P0010 [redacted]w[redacted]d SOL #Labor: Progressing well to active labor with expectant management. #Pain: Does not desire epidural at this time. #FWB: Cat 1 - overall reassuring #GBS negative #Anxiety: not on medications, family on their way   Discussed with Dr. Lambert Keto.  Lockie Pares, MD 11:04 AM

## 2016-09-23 NOTE — MAU Note (Signed)
PT  SAYS HURT BAD SINCE 3PM.   DENIES HSV AND MRSA.   [place]   WITH [place] .    VE ON Monday 1/2 CM.    GBS- NEG

## 2016-09-23 NOTE — Progress Notes (Signed)
Patient ID: Julie Maddox, female   DOB: 14-Feb-1995, 21 y.o.   MRN: [id number]  Labor Progress Note Julie Maddox is a 21 y.o. G2P0010 at [redacted]w[redacted]d presented for labor.  S: Comfortable after placement of epidural. Family supportive at bedside.  O:  BP 129/83   Pulse (!) 105   Temp 98.2 F (36.8 C) (Oral)   Resp 18   Ht 5\' 7"  (1.702 m)   Wt 86.2 kg (190 lb)   LMP 12/16/2015 (Exact Date)   SpO2 99%   BMI 29.76 kg/m   12:30: CVE: Dilation: 8 Effacement (%): 90 Station: 0 Presentation: Vertex Exam by:: J.Thornton, RN   125, moderate variability, positive accels, occasional variable decel, no lates Toco difficult to pick up - looks to be q3-4.   A&P: 21 y.o. G2P0010 [redacted]w[redacted]d admitted for labor. #Labor: Progressing well with expectant management. #Pain: Controlled with epidural. #FWB: Category 1, overall reassuring #GBS negative  Lockie Pares, MD 12:58 PM

## 2016-09-23 NOTE — H&P (Signed)
OBSTETRIC ADMISSION HISTORY AND PHYSICAL  Julie Maddox is a 21 y.o. female G2P0010 with IUP at [redacted]w[redacted]d by LMP presenting for SOL.  She reports contractions began yesterday and became more severe and frequent around 0600.  She endorses increased mucus and fluid loss this morning; reports "fluid running down my leg".  She reports +FMs, no VB, no blurry vision, headaches or peripheral edema or RUQ pain.  She plans on breast and bottle feeding. She request POPs for birth control. She received her prenatal care at [facility].  Dating: By LMP --->  Estimated Date of Delivery: 09/21/16  Sono:    @[redacted]w[redacted]d , CWD, normal anatomy, cephalic presentation, 35% EFW   Prenatal History/Complications: Clinic [facility] Prenatal Labs  Dating LMP Blood type: A/Positive/-- (02/15 1446)   Genetic Screen 1 Screen:  NT normal  AFP: normal    Antibody:Negative (02/15 1446)  Anatomic [place] Normal  Rubella: 1.51 (02/15 1446)  GTT Early:               Third trimester: wnl RPR: Non Reactive (06/19 1043)   Flu vaccine 02/27/16 HBsAg: Negative (02/15 1446)   TDaP vaccine 07/27/16                                        Rhogam: HIV:   nr  Baby Food Breast                                        TSV:[id number] (08/13 1422)negative (For PCN allergy, check sensitivities)  Contraception OCPs Pap: 2017 per pt   Circumcision HARPER    Pediatrician unsure   Support Person Mother      Past Medical History: Past Medical History:  Diagnosis Date  . Chlamydia     Past Surgical History: Past Surgical History:  Procedure Laterality Date  . NO PAST SURGERIES      Obstetrical History: OB History    Gravida Para Term Preterm AB Living   2 0 0 0 1 0   SAB TAB Ectopic Multiple Live Births   0 1 0 0        Social History: Social History   Social History  . Marital status: Single    Spouse name: N/A  . Number of children: N/A  . Years of education: N/A   Social History Main Topics  . Smoking status: Never Smoker  .  Smokeless tobacco: Never Used  . Alcohol use No     Comment: new years eve last alcoholic beverage  . Drug use: No  . Sexual activity: Yes    Birth control/ protection: None   Other Topics Concern  . None   Social History Narrative  . None    Family History: Family History  Problem Relation Age of Onset  . Parkinson's disease Maternal Grandmother   . Cancer Paternal Grandmother     Allergies: No Known Allergies  Prescriptions Prior to Admission  Medication Sig Dispense Refill Last Dose  . Prenatal Vit-Fe Fumarate-FA (MULTIVITAMIN-PRENATAL) 27-0.8 MG TABS tablet Take 1 tablet by mouth daily at 12 noon.   Past Week at Unknown time     Review of Systems   All systems reviewed and negative except as stated in HPI  Blood pressure 126/85, pulse 76, temperature 98.2 F (36.8 C), temperature source  Oral, resp. rate 20, last menstrual period 12/16/2015. General appearance: alert and cooperative Lungs: clear to auscultation bilaterally Heart: regular rate and rhythm Abdomen: soft, non-tender; bowel sounds normal Pelvic: deferred Extremities: Homans sign is negative, no sign of DVT Presentation: cephalic Fetal monitoring: baseline 135, moderate variability, +accels, 1 variable decel Uterine activity: q 3 min Dilation: 4 Effacement (%): 70 Station: -1 Exam by:: DCALLAWAY, RN     Prenatal labs: ABO, Rh: A/Positive/-- (02/15 1446) Antibody: Negative (02/15 1446) Rubella: 1.51 (02/15 1446) RPR: Non Reactive (06/19 1043)  HBsAg: Negative (02/15 1446)  HIV:   Nonreactive GBS: Negative (08/13 1422)  2 hr Glucola: WNL Genetic screening: AFP normal Anatomy US: normal female  Prenatal Transfer Tool  Maternal Diabetes: No Genetic Screening: Normal Maternal Ultrasounds/Referrals: Normal Fetal Ultrasounds or other Referrals:  None Maternal Substance Abuse:  No Significant Maternal Medications:  None Significant Maternal Lab Results: None  No results found for this or any  previous visit (from the past 24 hour(s)).  Patient Active Problem List   Diagnosis Date Noted  . Normal labor 09/23/2016  . Anxiety disorder affecting pregnancy, antepartum 06/04/2016  . Encounter for supervision of normal pregnancy, antepartum 02/27/2016    Assessment/Plan:  Julie Maddox is a 20 y.o. G2P0010 at [redacted]w[redacted]d here for SOL.   #Labor: Monitor, augment if needed.  Will perform fern testing to evaluate for SROM. #Pain: Epidural if desired #FWB:  Category 2 #ID:  GBS negative #MOF: Breast #MOC: POPs #Circ:  Desires; will obtain outpatient  Metta Clines, DO, PGY-2 09/23/2016, 7:54 AM  OB FELLOW HISTORY AND PHYSICAL ATTESTATION  I have seen and examined this patient; I agree with above documentation in the resident's note.    Julie Shelter, MD OB Fellow 09/23/2016, 11:02 AM

## 2016-09-23 NOTE — Anesthesia Pain Management Evaluation Note (Signed)
  CRNA Pain Management Visit Note  Patient: Julie Maddox, 21 y.o., female  "Hello I am a member of the anesthesia team at [facility]. We have an anesthesia team available at all times to provide care throughout the hospital, including epidural management and anesthesia for C-section. I don't know your plan for the delivery whether it a natural birth, water birth, IV sedation, nitrous supplementation, doula or epidural, but we want to meet your pain goals."   1.Was your pain managed to your expectations on prior hospitalizations?   No prior hospitalizations  2.What is your expectation for pain management during this hospitalization?     Epidural, IV pain meds and Nitrous Oxide  3.How can we help you reach that goal? Be available  Record the patient's initial score and the patient's pain goal.   Pain: 4  Pain Goal: 5 [facility] wants you to be able to say your pain was always managed very well.  [facility] 09/23/2016

## 2016-09-23 NOTE — Anesthesia Preprocedure Evaluation (Signed)
Anesthesia Evaluation  Patient identified by MRN, date of birth, ID band Patient awake    Reviewed: Allergy & Precautions, H&P , NPO status , Patient's Chart, lab work & pertinent test results, reviewed documented beta blocker date and time   Airway Mallampati: II  TM Distance: >3 FB Neck ROM: full    Dental no notable dental hx. (+) Dental Advidsory Given   Pulmonary neg pulmonary ROS,    Pulmonary exam normal breath sounds clear to auscultation       Cardiovascular Exercise Tolerance: Good negative cardio ROS   Rhythm:regular Rate:Normal     Neuro/Psych negative neurological ROS  negative psych ROS   GI/Hepatic negative GI ROS, Neg liver ROS,   Endo/Other  negative endocrine ROS  Renal/GU negative Renal ROS  negative genitourinary   Musculoskeletal   Abdominal   Peds  Hematology negative hematology ROS (+)   Anesthesia Other Findings   Reproductive/Obstetrics (+) Pregnancy                             Anesthesia Physical Anesthesia Plan  ASA: II  Anesthesia Plan: General   Post-op Pain Management:    Induction:   PONV Risk Score and Plan:   Airway Management Planned:   Additional Equipment:   Intra-op Plan:   Post-operative Plan:   Informed Consent: I have reviewed the patients History and Physical, chart, labs and discussed the procedure including the risks, benefits and alternatives for the proposed anesthesia with the patient or authorized representative who has indicated his/her understanding and acceptance.   Dental Advisory Given  Plan Discussed with: CRNA  Anesthesia Plan Comments:         Anesthesia Quick Evaluation

## 2016-09-24 ENCOUNTER — Encounter (HOSPITAL_COMMUNITY): Payer: Self-pay | Admitting: *Deleted

## 2016-09-24 MED ORDER — INFLUENZA VAC SPLIT QUAD 0.5 ML IM SUSY
0.5000 mL | PREFILLED_SYRINGE | INTRAMUSCULAR | Status: AC
  Administered 2016-09-25: 0.5 mL via INTRAMUSCULAR
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
## 2016-09-24 NOTE — Progress Notes (Signed)
Post Partum Day 1  Subjective:  Julie Maddox is a 21 y.o. G2P1011 [redacted]w[redacted]d s/p NSVD.  No acute events overnight.  Pt denies problems with ambulating, voiding or po intake.  She denies nausea or vomiting.  Pain is well controlled.  She has had flatus. She has not had bowel movement.  Lochia Small.  Plan for birth control is oral progesterone-only contraceptive.  Method of Feeding: breast  Objective: BP 112/70   Pulse 76   Temp 98.2 F (36.8 C) (Oral)   Resp 20   Ht 5\' 7"  (1.702 m)   Wt 86.2 kg (190 lb)   LMP 12/16/2015 (Exact Date)   SpO2 99%   Breastfeeding? Unknown   BMI 29.76 kg/m   Physical Exam:  General: alert, cooperative and no distress Lochia:normal flow Chest: no increased work of breathing Abdomen: soft, nontender, fundus firm at/below umbilicus Uterine Fundus: firm DVT Evaluation: No evidence of DVT seen on physical exam. Extremities: No edema   Recent Labs  09/23/16 0728  HGB 13.2  HCT 36.9    Assessment/Plan:  ASSESSMENT: Julie Maddox is a 21 y.o. G2P1011 [redacted]w[redacted]d ppd #1 s/p NSVD doing well.   Baby received inadequate prophylaxis for GBS, so will need to be monitored for 48 hours post delivery.  Patient will go home tomorrow once baby is discharged by pediatrics. Plan for discharge tomorrow   LOS: 1 day   Amanda C. Shan Levans, MD PGY-1, [facility] 09/24/2016 12:35 PM  I confirm that I have verified the information documented in the resident's note and that I have also personally reperformed the physical exam and all medical decision making activities.  Laury Deep, CNM 09/24/2016 4:30 PM

## 2016-09-24 NOTE — Lactation Note (Signed)
This note was copied from a baby's chart. Lactation Consultation Note New mom breast/formula. Has mainly been giving formula. Discussed w/mom supplementing how much according to hours of age. Encouraged to put baby to breast first before giving formula. DEBP setup Mom shown how to use DEBP & how to disassemble, clean, & reassemble parts. Mom knows to pump q3h for 15-20 min. Mom pumped for 10 min. Then baby started crying. Assisted baby to breast.  Mom has round breast w/semi flat nipples. Breast, areola heavy. Hand expression taught w/colostrum noted. Baby unable to maintain a latch. Baby had wide flange, finally latched, frustrated d/t not deep enough, mom c/o pain. Fitted #79 NS taught application, mom demonstrated application. Latched in football position. Baby BF well. Mom stated she is doing well, feels good. Mom encouraged to feed baby 8-12 times/24 hours and with feeding cues. Newborn behavior discussed, STS, I&O, cluster feeding, supply and demand reviewed. Mom is giving formula, reviewed feeding amount, encouraged BF first, discussed benefits of BF.  [place] brochure given w/resources, support groups and [place] services. Patient Name: Julie Maddox [id number] Date: 09/24/2016 Reason for consult: Initial assessment   Maternal Data Has patient been taught Hand Expression?: Yes Does the patient have breastfeeding experience prior to this delivery?: No  Feeding Feeding Type: Breast Fed Nipple Type: Slow - flow Length of feed: 10 min (still BF)  LATCH Score Latch: Repeated attempts needed to sustain latch, nipple held in mouth throughout feeding, stimulation needed to elicit sucking reflex.  Audible Swallowing: None  Type of Nipple: Everted at rest and after stimulation (short shaft)  Comfort (Breast/Nipple): Soft / non-tender  Hold (Positioning): Assistance needed to correctly position infant at breast and maintain latch.  LATCH Score: 6  Interventions Interventions: Breast  feeding basics reviewed;Support pillows;Assisted with latch;Position options;Skin to skin;Breast massage;Hand express;Shells;Pre-pump if needed;DEBP;Breast compression;Adjust position  Lactation Tools Discussed/Used Tools: Shells;Pump;Nipple Shields Nipple shield size: 20 Shell Type: Inverted Breast pump type: Double-Electric Breast Pump WIC Program: Yes Pump Review: Setup, frequency, and cleaning;Milk Storage Initiated by:: Allayne Stack RN IBCLC Date initiated:: 09/24/16   Consult Status Consult Status: Follow-up Date: 09/25/16 Follow-up type: In-patient    Theodoro Kalata 09/24/2016, 7:54 AM

## 2016-09-25 NOTE — Lactation Note (Signed)
This note was copied from a baby's chart. Lactation Consultation Note  Patient Name: Julie Maddox [id number] Date: 09/25/2016 Reason for consult: Follow-up assessment  Baby 32 hours old. Mom reports that she thought the baby was hungry, so she offered him a bottle, but he just wanted to be held. Mom states that she intends to keep trying to BF, but she declined assistance with latching. Mom states that she has already talked to [facility] about getting a pump. Mom declined a [facility] loaner, so demonstrated how to use piston and enc taking pumping kit at D/C. Enc mom to put baby to breast first with each feeding, and then supplement with EBM/formula. Enc mom to post-pump followed by hand expression. Mom aware of OP/BFSG and [place] phone line assistance after D/C.   Maternal Data    Feeding Feeding Type: Formula Nipple Type: Slow - flow  LATCH Score                   Interventions    Lactation Tools Discussed/Used     Consult Status Consult Status: Complete    Andres Labrum 09/25/2016, 11:13 AM

## 2016-09-25 NOTE — Discharge Summary (Signed)
OB Discharge Summary     Patient Name: Julie Maddox DOB: [birth year] MRN: [id number]  Date of admission: 09/23/2016 Delivering MD: Lockie Pares   Date of discharge: 09/25/2016  Admitting diagnosis: 30 WEEKS CTX Intrauterine pregnancy: [redacted]w[redacted]d     Secondary diagnosis:  Principal Problem:   SVD (spontaneous vaginal delivery) Active Problems:   Encounter for supervision of normal pregnancy, antepartum   Anxiety disorder affecting pregnancy, antepartum  Additional problems: None     Discharge diagnosis: Term Pregnancy Delivered                                                                                                Post partum procedures:None  Augmentation: None  Complications: None  Hospital course:  Onset of Labor With Vaginal Delivery     21 y.o. yo G2P1011 at [redacted]w[redacted]d was admitted in Latent Labor on 09/23/2016. Patient had an uncomplicated labor course as follows:  Membrane Rupture Time/Date: 3:00 PM ,09/23/2016   Intrapartum Procedures: Episiotomy: None [1]                                         Lacerations:  2nd degree [3];Perineal [11];Labial [10]  Patient had a delivery of a Viable infant. 09/23/2016  Information for the patient's newborn:  Julie, Maddox [[id number]]  Delivery Method: Vaginal, Spontaneous Delivery (Filed from Delivery Summary)   #Anxiety: not on medication. Appropriate throughout hospital course.  Pateint had an uncomplicated postpartum course.  She is ambulating, tolerating a regular diet, passing flatus, and urinating well. Patient is discharged home in stable condition on 09/25/16.   Physical exam  Vitals:   09/23/16 1925 09/24/16 0400 09/24/16 1813 09/25/16 0604  BP: 123/80 112/70 122/62 118/76  Pulse: (!) 105 76 84 75  Resp: 20 20 18 18   Temp: 98.4 F (36.9 C) 98.2 F (36.8 C) 97.7 F (36.5 C) 99 F (37.2 C)  TempSrc: Oral Oral Oral   SpO2:      Weight:      Height:       General: alert, cooperative and no  distress Lochia: appropriate Uterine Fundus: firm Incision: N/A DVT Evaluation: No evidence of DVT seen on physical exam. No significant calf/ankle edema. Labs: Lab Results  Component Value Date   WBC 8.7 09/23/2016   HGB 13.2 09/23/2016   HCT 36.9 09/23/2016   MCV 88.7 09/23/2016   PLT 153 09/23/2016   No flowsheet data found.  Discharge instruction: per After Visit Summary and "Baby and Me Booklet".  After visit meds:  Allergies as of 09/25/2016   No Known Allergies     Medication List    TAKE these medications   multivitamin-prenatal 27-0.8 MG Tabs tablet Take 1 tablet by mouth daily at 12 noon.            Discharge Care Instructions        Start     Ordered   09/25/16 0000  Increase activity slowly     09/25/16 1153  09/25/16 0000  Diet - low sodium heart healthy     09/25/16 1153   09/23/16 0000  OB RESULT CONSOLE Group B Strep    Comments:  This external order was created through the Results Console.   09/23/16 4707      Diet: routine diet  Activity: Advance as tolerated. Pelvic rest for 6 weeks.   Outpatient follow up:6 weeks Follow up Appt:Future Appointments Date Time Provider [place]  10/22/2016 8:30 AM Kandis Cocking A, CNM [facility] None   Follow up Visit:No Follow-up on file.  Postpartum contraception: Progesterone only pills  Newborn Data: Live born female  Birth Weight: 7 lb 14 oz (3572 g) APGAR: 8, 9  Baby Feeding: Bottle and Breast Disposition:home with mother   09/25/2016 Lockie Pares, MD

## 2016-09-28 ENCOUNTER — Inpatient Hospital Stay (HOSPITAL_COMMUNITY): Admission: RE | Admit: 2016-09-28 | Payer: Medicaid Other | Source: Ambulatory Visit

## 2016-10-22 ENCOUNTER — Ambulatory Visit: Payer: Self-pay | Admitting: Certified Nurse Midwife

## 2016-10-26 ENCOUNTER — Ambulatory Visit: Payer: Self-pay | Admitting: Certified Nurse Midwife

## 2016-11-02 ENCOUNTER — Ambulatory Visit: Payer: Self-pay | Admitting: Certified Nurse Midwife

## 2016-11-10 ENCOUNTER — Ambulatory Visit (INDEPENDENT_AMBULATORY_CARE_PROVIDER_SITE_OTHER): Payer: Self-pay | Admitting: Certified Nurse Midwife

## 2016-11-10 ENCOUNTER — Encounter: Payer: Self-pay | Admitting: Certified Nurse Midwife

## 2016-11-10 ENCOUNTER — Other Ambulatory Visit (HOSPITAL_COMMUNITY)
Admission: RE | Admit: 2016-11-10 | Discharge: 2016-11-10 | Disposition: A | Discharge status: Home / Self Care | Payer: Medicaid Other | Source: Ambulatory Visit | Attending: Certified Nurse Midwife | Admitting: Certified Nurse Midwife

## 2016-11-10 DIAGNOSIS — Z30011 Encounter for initial prescription of contraceptive pills: Secondary | ICD-10-CM

## 2016-11-10 DIAGNOSIS — Z3202 Encounter for pregnancy test, result negative: Secondary | ICD-10-CM

## 2016-11-10 LAB — POCT URINE PREGNANCY: PREG TEST UR: NEGATIVE

## 2016-11-10 MED ORDER — NORETHIN-ETH ESTRAD-FE BIPHAS 1 MG-10 MCG / 10 MCG PO TABS: 1.0000 | ORAL_TABLET | Freq: Every day | ORAL | 4 refills | Status: DC

## 2016-11-10 NOTE — Progress Notes (Signed)
Wants Rx for PNV and OCP.

## 2016-11-10 NOTE — Progress Notes (Signed)
  Subjective:     Julie Maddox is a 21 y.o. female who presents for a postpartum visit. She is 7 weeks postpartum following a spontaneous vaginal delivery. I have fully reviewed the prenatal and intrapartum course. The delivery was at 40.2 gestational weeks. Outcome: spontaneous vaginal delivery. Anesthesia: epidural. Postpartum course has been GOOD. Baby's course has been GOOD. Baby is feeding by both breast and bottle - Similac Advance. Bleeding no bleeding. Bowel function is abnormal: constipated. Bladder function is normal. Patient is not sexually active. Contraception method is none. Postpartum depression screening: negative.  The following portions of the patient's history were reviewed and updated as appropriate: allergies, current medications, past family history, past medical history, past social history, past surgical history and problem list.  Review of Systems Pertinent items noted in HPI and remainder of comprehensive ROS otherwise negative.   Objective:    BP 120/75   Pulse 73   Ht 5\' 7"  (1.702 m)   Wt 167 lb 12.8 oz (76.1 kg)   Breastfeeding? Yes   BMI 26.28 kg/m   General:  alert, cooperative and no distress   Breasts:  inspection negative, no nipple discharge or bleeding, no masses or nodularity palpable  Lungs: clear to auscultation bilaterally  Heart:  regular rate and rhythm, S1, S2 normal, no murmur, click, rub or gallop  Abdomen: soft, non-tender; bowel sounds normal; no masses,  no organomegaly   Vulva:  normal  Vagina: normal vagina, no discharge, exudate, lesion, or erythema  Cervix:  no bleeding following Pap and no cervical motion tenderness  Corpus: normal  Adnexa:  normal adnexa  Rectal Exam: Not performed.        Assessment:    Normal 7 week postpartum exam. Pap smear done at today's visit.   Plan:    1. Contraception: abstinence, condoms and OCP (estrogen/progesterone) 2. Starting LoLo: ACHES reviewed. 3. Follow up in: 1 year or as needed.

## 2016-11-13 LAB — CYTOLOGY - PAP
DIAGNOSIS: UNDETERMINED — AB
HPV: NOT DETECTED

## 2016-11-16 ENCOUNTER — Other Ambulatory Visit: Payer: Self-pay | Admitting: Certified Nurse Midwife

## 2016-11-16 DIAGNOSIS — R87610 Atypical squamous cells of undetermined significance on cytologic smear of cervix (ASC-US): Secondary | ICD-10-CM

## 2017-03-16 ENCOUNTER — Ambulatory Visit: Payer: Self-pay | Admitting: Obstetrics & Gynecology

## 2017-09-27 ENCOUNTER — Emergency Department (HOSPITAL_COMMUNITY)
Admission: EM | Admit: 2017-09-27 | Discharge: 2017-09-27 | Disposition: A | Discharge status: Home / Self Care | Payer: Medicaid Other | Attending: Emergency Medicine | Admitting: Emergency Medicine

## 2017-09-27 ENCOUNTER — Encounter (HOSPITAL_COMMUNITY): Payer: Self-pay | Admitting: *Deleted

## 2017-09-27 DIAGNOSIS — R110 Nausea: Secondary | ICD-10-CM | POA: Diagnosis present

## 2017-09-27 DIAGNOSIS — Z5321 Procedure and treatment not carried out due to patient leaving prior to being seen by health care provider: Secondary | ICD-10-CM | POA: Insufficient documentation

## 2017-09-27 NOTE — ED Notes (Signed)
Called in lobby again for room, no answer

## 2017-09-27 NOTE — ED Triage Notes (Signed)
Pt in c/o nausea and decreased appetite for the last few days, pt states this has been going on for awhile, pt appears well no distress noted

## 2017-09-27 NOTE — ED Notes (Signed)
Patient called from waiting room with no answer x3. Will re-attempt in 5 minutes

## 2017-09-27 NOTE — ED Notes (Signed)
09/27/2017. Called for follow-up , no answer.

## 2018-11-29 IMAGING — US US MFM OB COMP +14 WKS
1 series · 14 of 28 positions shown · non-contrast
Comparison: none

[Series 1: us mfm ob comp +14 wks · 117 acquisitions, 14 frames shown]
[im 5/117]
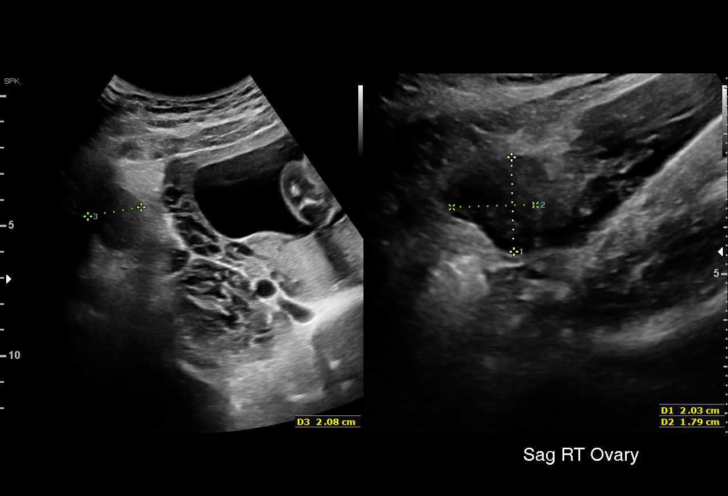
[im 13/117]
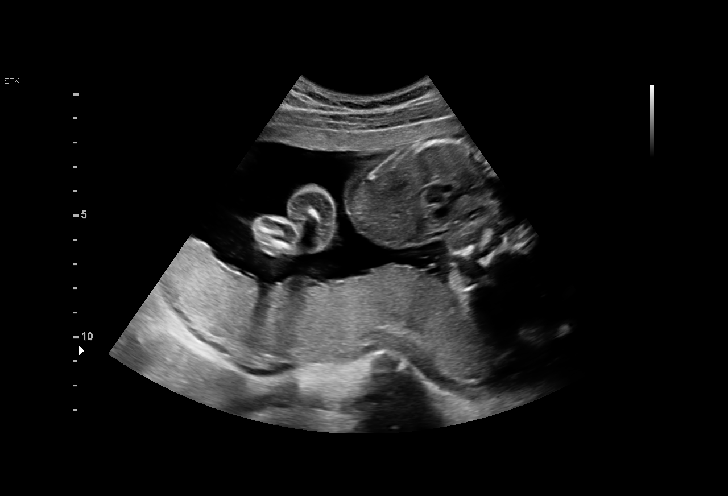
[im 22/117]
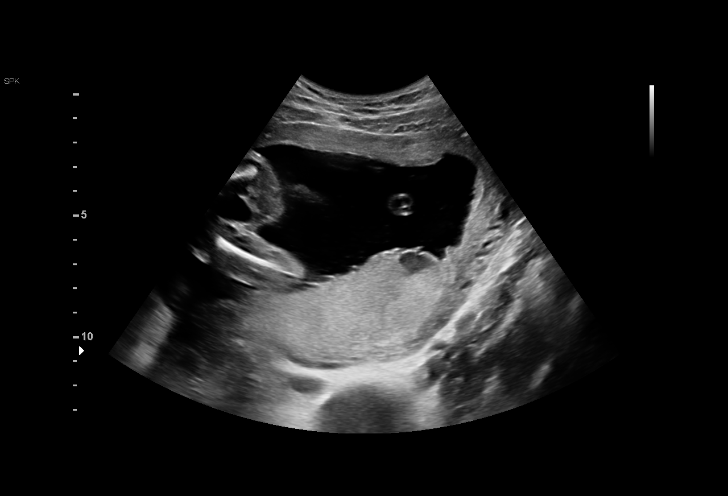
[im 31/117]
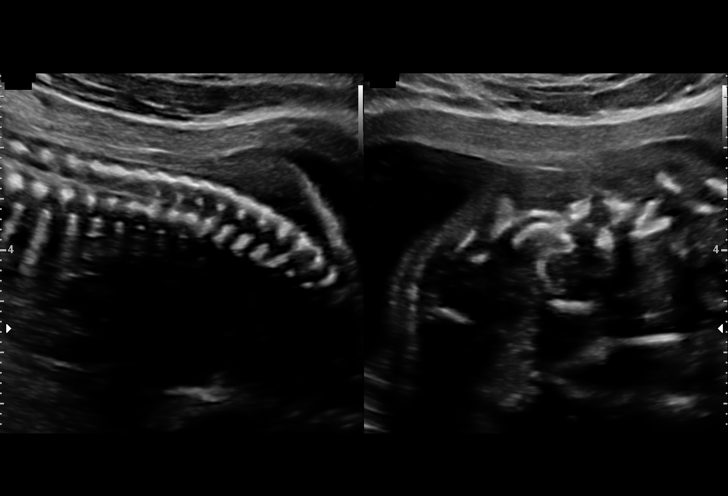
[im 39/117]
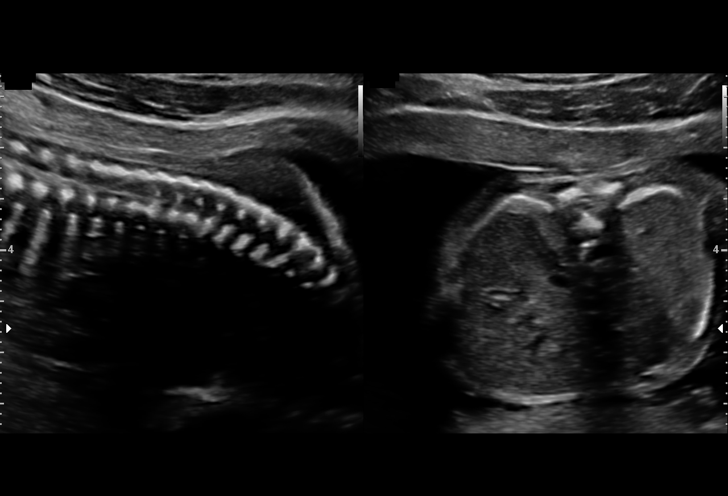
[im 48/117]
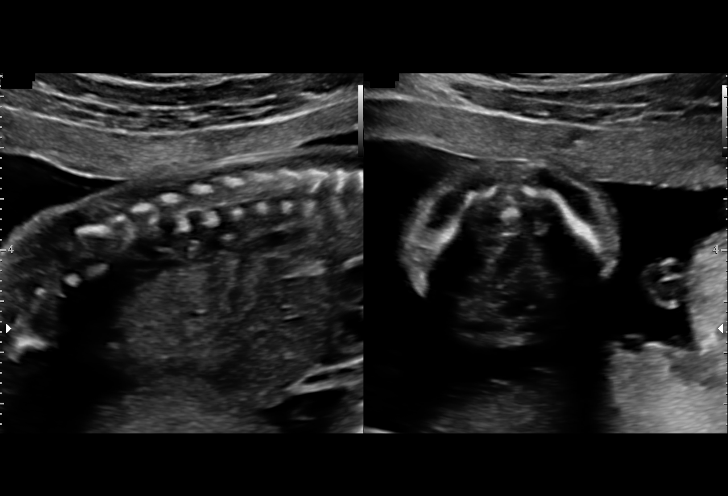
[im 56/117]
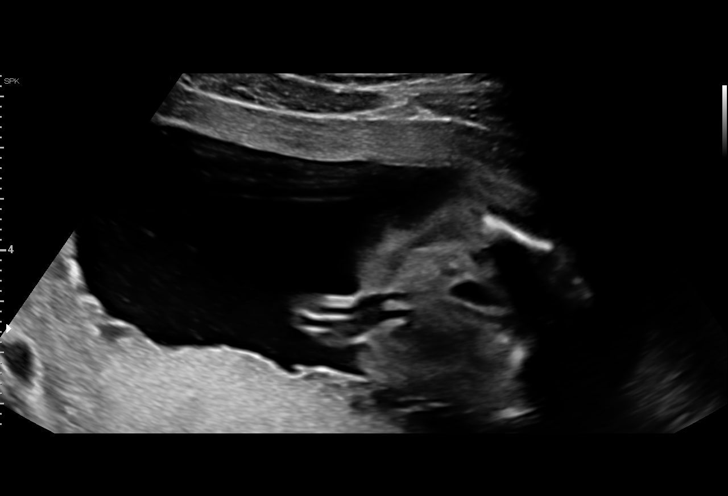
[im 65/117]
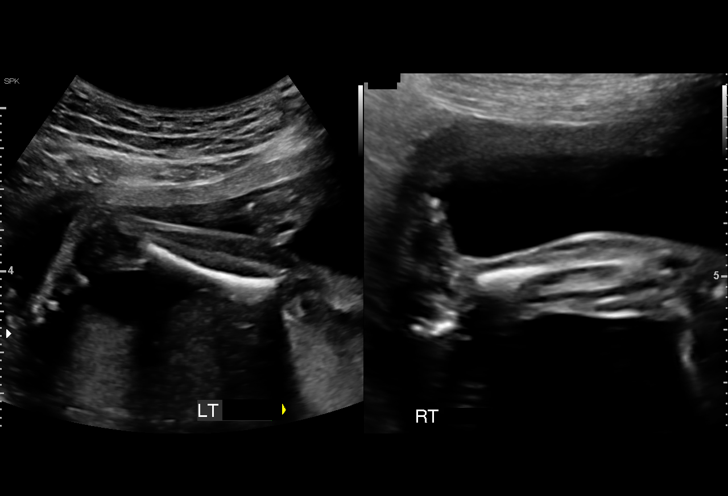
[im 74/117]
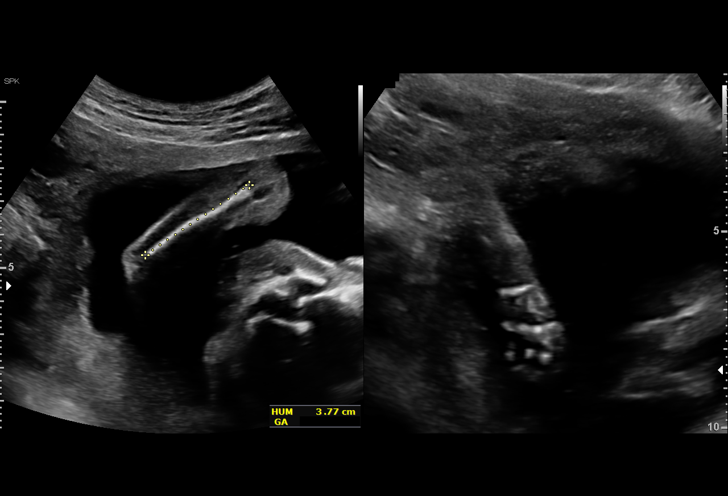
[im 82/117]
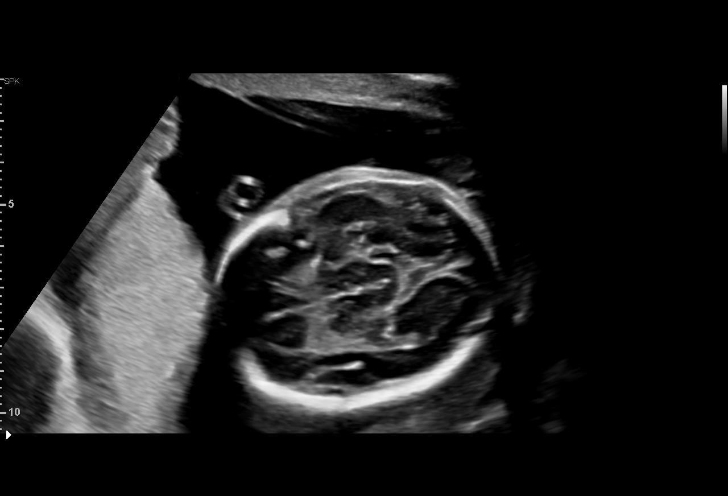
[im 91/117]
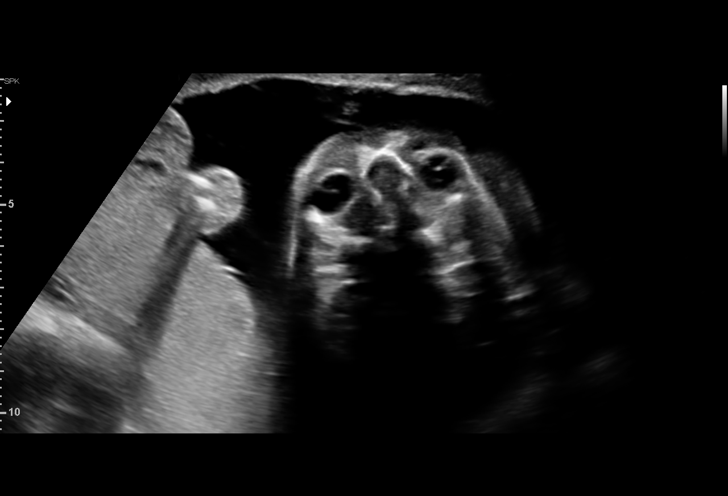
[im 99/117]
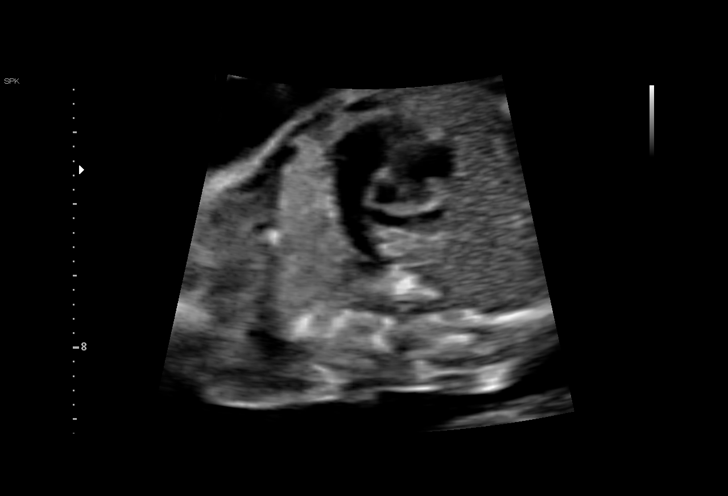
[im 108/117]
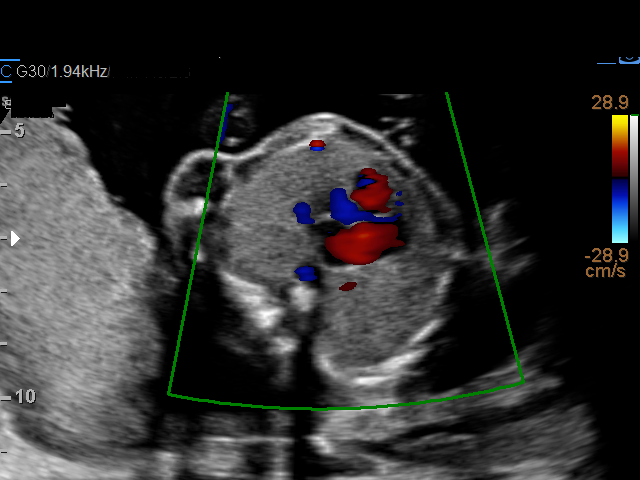
[im 117/117]
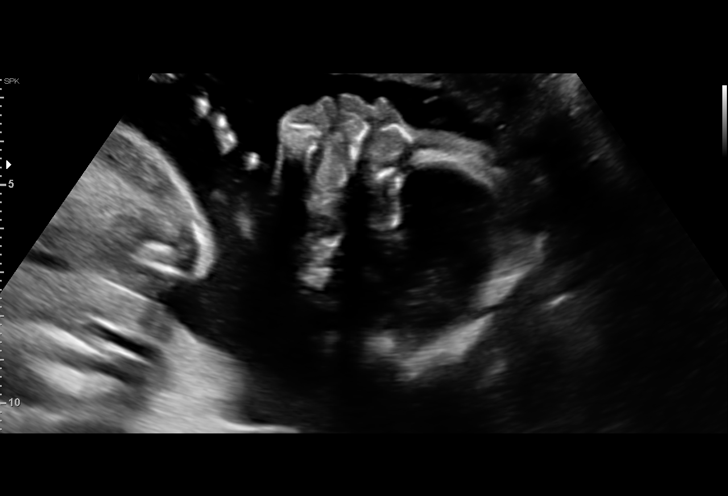

[14 of 28 positions shown; findings below may reference images not displayed]

1  NISCHOL BERTIE           [id number]      [id number]     [id number]
Indications

21 weeks gestation of pregnancy
Encounter for fetal anatomic survey
OB History

Gravidity:    2         Term:   0        Prem:   0        SAB:   0
TOP:          1       Ectopic:  0        Living: 0
Fetal Evaluation

Num Of Fetuses:     1
Fetal Heart         130
Rate(bpm):
Cardiac Activity:   Observed
Presentation:       Cephalic
Placenta:           Posterior, above cervical os
P. Cord Insertion:  Visualized, central

Amniotic Fluid
AFI FV:      Subjectively within normal limits

Largest Pocket(cm)
5.38
Biometry

BPD:      54.3  mm     G. Age:  22w 4d         89  %    CI:        76.36   %   70 - 86
FL/HC:      18.0   %   15.9 -
HC:      196.9  mm     G. Age:  21w 6d         66  %    HC/AC:      1.22       1.06 -
AC:      161.7  mm     G. Age:  21w 2d         42  %    FL/BPD:     65.4   %
FL:       35.5  mm     G. Age:  21w 1d         39  %    FL/AC:      22.0   %   20 - 24
HUM:        37  mm     G. Age:  22w 6d         89  %
CER:      22.6  mm     G. Age:  21w 2d         50  %
NFT:       4.5  mm
CM:        6.2  mm

Est. FW:     418  gm    0 lb 15 oz      46  %
Gestational Age

LMP:           21w 2d       Date:   12/16/15                 EDD:   09/21/16
U/S Today:     21w 5d                                        EDD:   09/18/16
Best:          21w 2d    Det. By:   LMP  (12/16/15)          EDD:   09/21/16
Anatomy

Cranium:               Appears normal         Aortic Arch:            Appears normal
Cavum:                 Appears normal         Ductal Arch:            Not well visualized
Ventricles:            Appears normal         Diaphragm:              Appears normal
Choroid Plexus:        Appears normal         Stomach:                Appears normal, left
sided
Cerebellum:            Appears normal         Abdomen:                Appears normal
Posterior Fossa:       Appears normal         Abdominal Wall:         Appears nml (cord
insert, abd wall)
Nuchal Fold:           Appears normal         Cord Vessels:           Appears normal (3
vessel cord)
Face:                  Appears normal         Kidneys:                Appear normal
(orbits and profile)
Lips:                  Not well visualized    Bladder:                Appears normal
Thoracic:              Appears normal         Spine:                  Appears normal
Heart:                 Appears normal         Upper Extremities:      Appears normal
(4CH, axis, and situs
RVOT:                  Appears normal         Lower Extremities:      Appears normal
LVOT:                  Appears normal

Other:  Parent does not wish to know sex  Fetus appears to be a male. Both
heels and 5th digits notes Technically difficult due to fetal position.
Cervix Uterus Adnexa

Cervix
Length:           3.05  cm.
Normal appearance by transabdominal scan.

Uterus
No abnormality visualized.

Left Ovary
Not visualized.

Right Ovary
Within normal limits.

Adnexa:       No abnormality visualized.
Impression

SIUP at 21+2 weeks
Normal detailed fetal anatomy; limited views of upper lip and
DA
Normal amniotic fluid volume
Measurements consistent with LMP dating
Recommendations

Follow-up as clinically indicated or follow-up ultrasound in 4-6
weeks to complete anatomy survey

## 2021-11-16 ENCOUNTER — Ambulatory Visit
Admission: EM | Admit: 2021-11-16 | Discharge: 2021-11-16 | Disposition: A | Discharge status: Home / Self Care | Payer: Medicaid Other | Attending: Emergency Medicine | Admitting: Emergency Medicine

## 2021-11-16 DIAGNOSIS — R112 Nausea with vomiting, unspecified: Secondary | ICD-10-CM | POA: Diagnosis not present

## 2021-11-16 LAB — POCT URINALYSIS DIP (MANUAL ENTRY)
Bilirubin, UA: NEGATIVE
Glucose, UA: NEGATIVE mg/dL
Leukocytes, UA: NEGATIVE
Nitrite, UA: NEGATIVE
Protein Ur, POC: 30 mg/dL — AB
Spec Grav, UA: >=1.03 — AB (ref 1.010–1.025)
Urobilinogen, UA: 1 E.U./dL
pH, UA: 5.5 (ref 5.0–8.0)

## 2021-11-16 LAB — POCT INFLUENZA A/B
Influenza A, POC: NEGATIVE
Influenza B, POC: NEGATIVE

## 2021-11-16 LAB — POCT URINE PREGNANCY: Preg Test, Ur: NEGATIVE

## 2021-11-16 MED ORDER — ONDANSETRON HCL 4 MG/2ML IJ SOLN
8.0000 mg | Freq: Once | INTRAMUSCULAR | Status: AC
  Administered 2021-11-16: 8 mg via INTRAMUSCULAR

## 2021-11-16 MED ORDER — ONDANSETRON 8 MG PO TBDP: 8.0000 mg | ORAL_TABLET | Freq: Three times a day (TID) | ORAL | 0 refills | Status: AC | PRN

## 2021-11-16 NOTE — Discharge Instructions (Addendum)
Your flu test today was negative.  Your pregnancy test today was negative.  Your blood pressure today is very good but you do appear to be a little bit dehydrated.  Please be sure that you are drinking plenty of clear fluids such as Pedialyte or Gatorade.  Please do not drink water to replace your fluids as this actually might make you feel worse.  I have sent a prescription for Zofran to your pharmacy that you can continue taking every 8 hours as needed for nausea.  Your next dose should be due around 8 PM tonight.  Please let us know if you are not feeling any better please go to the emergency room if you continue to feel worse.  Thank you for visiting urgent care today.

## 2021-11-16 NOTE — ED Triage Notes (Signed)
Pt c/o nausea and vomiting that began this morning.   Home interventions: none

## 2021-11-16 NOTE — ED Provider Notes (Signed)
[facility]    CSN: [id number] Arrival date & time: 11/16/21  1100    HISTORY   Chief Complaint  Patient presents with   Emesis   HPI Julie Maddox is a pleasant, 26 y.o. female who presents to urgent care today. Patient here with her son today who was sick.  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  Has persistent nausea and vomiting this morning, patient persistently vomiting throughout her visit today filling out to emesis bags.  Patient has not been able to keep any liquids or solids down.  Patient has normal vital signs at this time.  Urinalysis today is concerning for dehydration.  The history is provided by the patient.  Emesis Severity:  Severe Timing:  Constant Quality:  Bilious material Progression:  Unchanged Chronicity:  New Recent urination:  Decreased Context: not post-tussive and not self-induced   Relieved by:  None tried Ineffective treatments:  None tried Associated symptoms: no abdominal pain, no arthralgias, no chills, no cough, no diarrhea, no fever, no headaches, no myalgias, no sore throat and no URI   Risk factors: alcohol use    Past Medical History:  Diagnosis Date   Chlamydia    Patient Active Problem List   Diagnosis Date Noted   ASCUS of cervix with negative high risk HPV 11/16/2016   Anxiety disorder affecting pregnancy, 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 Authorizing Provider  ondansetron (ZOFRAN-ODT) 8 MG disintegrating tablet Take 1 tablet (8 mg total) by mouth every 8 (eight) hours as needed for nausea or vomiting. 11/16/21  Yes Lynden Oxford Scales, PA-C  Norethindrone-Ethinyl Estradiol-Fe Biphas (LO LOESTRIN FE) 1 MG-10 MCG / 10 MCG tablet Take 1 tablet by mouth daily. Take 1 tablet by mouth daily. 11/10/16   Kandis Cocking A, CNM  Prenatal Vit-Fe Fumarate-FA (MULTIVITAMIN-PRENATAL) 27-0.8 MG TABS tablet Take 1 tablet by mouth daily at 12 noon.    [provider]    Family History Family History  Problem Relation Age of Onset   Parkinson's disease Maternal Grandmother    Cancer Paternal Grandmother    Social History Social History   Tobacco Use   Smoking status: Never   Smokeless tobacco: Never  Substance Use Topics   Alcohol use: No    Comment: new years eve last alcoholic beverage   Drug use: No   Allergies   Patient has no known allergies.  Review of Systems Review of Systems  Constitutional:  Negative for chills and fever.  HENT:  Negative for sore throat.   Respiratory:  Negative for cough.   Gastrointestinal:  Positive for vomiting. Negative for abdominal pain and diarrhea.  Musculoskeletal:  Negative for arthralgias and myalgias.  Neurological:  Negative for headaches.   Pertinent findings revealed after performing a 14 point review of systems has been noted in the history of present illness.  Physical Exam Triage Vital Signs ED Triage Vitals  Enc Vitals Group     BP 11/08/20 0827 (!) 147/82     Pulse Rate 11/08/20 0827 72     Resp 11/08/20 0827 18     Temp 11/08/20 0827 98.3 F (36.8 C)     Temp Source 11/08/20 0827 Oral     SpO2 11/08/20 0827 98 %     Weight --      Height --      Head Circumference --      Peak Flow --      Pain Score 11/08/20 0826 5     Pain Loc --      Pain Edu? --      Excl. in [place]? --   No data found.  Updated Vital Signs BP 126/85 (BP Location: Right Arm)   Pulse 83   Temp 97.6 F (36.4 C) (Oral)   Resp 18   LMP  (Approximate)   SpO2 98%   Breastfeeding No   Physical Exam Vitals and nursing note reviewed.  Constitutional:       General: She is not in acute distress.    Appearance: Normal appearance.  HENT:     Head: Normocephalic and atraumatic.  Eyes:     Pupils: Pupils are equal, round, and reactive to light.  Cardiovascular:     Rate and Rhythm: Normal rate and regular rhythm.  Pulmonary:     Effort: Pulmonary effort is normal.     Breath sounds: Normal breath sounds.  Abdominal:     General: Abdomen is flat. Bowel sounds are normal.     Palpations: Abdomen is soft.     Tenderness: There is generalized abdominal tenderness.     Hernia: No hernia is present.     Comments: Patient vomiting persistently throughout visit  Musculoskeletal:        General: Normal range of motion.     Cervical back: Normal range of motion and neck supple.  Skin:    General: Skin is warm and dry.  Neurological:     General: No focal deficit present.     Mental Status: She is alert and oriented to person, place, and time. Mental status is at baseline.  Psychiatric:        Mood and Affect: Mood normal.        Behavior: Behavior normal.        Thought Content: Thought content normal.        Judgment: Judgment normal.     Visual Acuity Right Eye Distance:   Left Eye Distance:   Bilateral Distance:    Right Eye Near:   Left Eye Near:    Bilateral Near:     UC Couse / Diagnostics / Procedures:     Radiology No results found.  Procedures Procedures (including critical care time) EKG  Pending results:  Labs Reviewed  POCT URINALYSIS DIP (MANUAL ENTRY) - Abnormal; Notable for the following components:      Result Value   Ketones, POC UA large (80) (*)    Spec Grav, UA >=1.030 (*)    Blood, UA large (*)    Protein Ur, POC =30 (*)    All other components within normal limits  POCT URINE PREGNANCY  POCT INFLUENZA A/B    Medications Ordered in UC: Medications  ondansetron (ZOFRAN) injection 8 mg (8 mg Intramuscular Given 11/16/21 1121)    UC Diagnoses / Final Clinical Impressions(s)   I have reviewed the  triage vital signs and the nursing notes.  Pertinent labs & imaging results that were available during my care of the patient were reviewed by me and considered in my medical decision making (see chart for details).    Final diagnoses:  Nausea and vomiting, unspecified vomiting type   Patient provided with an injection of Zofran during her visit today.  Patient reported relief of nausea and vomiting within 20 minutes of injection.  Patient provided with a prescription for Zofran to take every 8 hours for the next few days.  Patient encouraged to drink electrolyte replacement solution only for the next 24 hours, then begin bland foods slowly and small amounts at frequent.  Patient advised to go to the ED if unable to keep liquids down.  Return precautions advised.  ED Prescriptions     Medication Sig Dispense Auth. Provider   ondansetron (ZOFRAN-ODT) 8 MG disintegrating tablet Take 1 tablet (8 mg total) by mouth every 8 (eight) hours as needed for nausea or vomiting. 20 tablet Lynden Oxford Scales, PA-C      PDMP not reviewed this encounter.  Pending results:  Labs Reviewed  POCT URINALYSIS DIP (MANUAL ENTRY) - Abnormal; Notable for the following components:      Result Value   Ketones, POC UA large (80) (*)    Spec Grav, UA >=1.030 (*)    Blood, UA large (*)    Protein Ur, POC =30 (*)    All other components within normal limits  POCT URINE PREGNANCY  POCT INFLUENZA A/B    Discharge Instructions:   Discharge Instructions      Your flu test today was negative.  Your pregnancy test today was negative.  Your blood pressure today is very good but you do appear to be a little bit dehydrated.  Please be sure that you are drinking plenty of clear fluids such as Pedialyte or Gatorade.  Please do not drink water to replace your fluids as this actually might make you feel worse.  I have sent a prescription for Zofran to your pharmacy that you can continue taking every 8 hours as  needed for nausea.  Your next dose should be due around 8 PM tonight.  Please let us know if you are not feeling any better please go to the emergency room if you continue to feel worse.  Thank you for visiting urgent care today.    Disposition Upon Discharge:  Condition: stable for discharge home  Patient presented with an acute illness with associated systemic symptoms and significant discomfort requiring urgent management. In my opinion, this is a condition that a prudent lay person (someone who possesses an average knowledge of health and medicine) may potentially expect to result in complications if not addressed urgently such as respiratory distress, impairment of bodily function or dysfunction of bodily organs.   Routine symptom specific, illness specific and/or disease specific instructions were discussed with the patient and/or caregiver at length.   As such, the patient has been evaluated and assessed, work-up was performed and treatment was provided in alignment with urgent care protocols and evidence based medicine.  Patient/parent/caregiver has been advised that the patient may require follow up for further testing and treatment if the symptoms continue in spite of treatment, as clinically indicated and appropriate.  Patient/parent/caregiver has been advised to return to the [facility] or PCP if no better; to PCP or the Emergency Department if new signs and symptoms develop, or if the current signs or symptoms continue to change  or worsen for further workup, evaluation and treatment as clinically indicated and appropriate  The patient will follow up with their current PCP if and as advised. If the patient does not currently have a PCP we will assist them in obtaining one.   The patient may need specialty follow up if the symptoms continue, in spite of conservative treatment and management, for further workup, evaluation, consultation and treatment as clinically indicated and  appropriate.   Patient/parent/caregiver verbalized understanding and agreement of plan as discussed.  All questions were addressed during visit.  Please see discharge instructions below for further details of plan.  This office note has been dictated using Museum/gallery curator.  Unfortunately, this method of dictation can sometimes lead to typographical or grammatical errors.  I apologize for your inconvenience in advance if this occurs.  Please do not hesitate to reach out to me if clarification is needed.      Lynden Oxford Scales, PA-C 11/19/21 0825

## 2022-01-15 ENCOUNTER — Ambulatory Visit: Payer: Medicaid Other | Admitting: Nurse Practitioner

## 2022-08-16 ENCOUNTER — Encounter (HOSPITAL_COMMUNITY): Payer: Self-pay

## 2022-08-16 ENCOUNTER — Ambulatory Visit (INDEPENDENT_AMBULATORY_CARE_PROVIDER_SITE_OTHER): Payer: Medicaid Other

## 2022-08-16 ENCOUNTER — Ambulatory Visit (HOSPITAL_COMMUNITY)
Admission: EM | Admit: 2022-08-16 | Discharge: 2022-08-16 | Disposition: A | Discharge status: Home / Self Care | Payer: Medicaid Other

## 2022-08-16 DIAGNOSIS — S161XXA Strain of muscle, fascia and tendon at neck level, initial encounter: Secondary | ICD-10-CM | POA: Diagnosis not present

## 2022-08-16 DIAGNOSIS — M542 Cervicalgia: Secondary | ICD-10-CM

## 2022-08-16 MED ORDER — KETOROLAC TROMETHAMINE 30 MG/ML IJ SOLN
INTRAMUSCULAR | Status: AC
  Filled 2022-08-16: qty 1

## 2022-08-16 MED ORDER — BACLOFEN 10 MG PO TABS: 10.0000 mg | ORAL_TABLET | Freq: Three times a day (TID) | ORAL | 0 refills | Status: AC

## 2022-08-16 MED ORDER — NAPROXEN 375 MG PO TABS
375.0000 mg | ORAL_TABLET | Freq: Two times a day (BID) | ORAL | 0 refills | Status: AC
Start: 2022-08-16 — End: ?

## 2022-08-16 MED ORDER — KETOROLAC TROMETHAMINE 30 MG/ML IJ SOLN
30.0000 mg | Freq: Once | INTRAMUSCULAR | Status: AC
  Administered 2022-08-16: 30 mg via INTRAMUSCULAR
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
## 2022-08-16 NOTE — Discharge Instructions (Addendum)
-  Will give you Toradol shot x 1 for your pain, prescribed naproxen to take twice a day starting tomorrow. -Baclofen as prescribed for your muscle tenderness. -Report new or worsening symptoms to the clinic or local emergency room. - Call your PCP to request referred to ortho (Phone number given)

## 2023-06-26 ENCOUNTER — Emergency Department (HOSPITAL_COMMUNITY)

## 2023-06-26 ENCOUNTER — Other Ambulatory Visit: Payer: Self-pay

## 2023-06-26 ENCOUNTER — Encounter (HOSPITAL_COMMUNITY): Payer: Self-pay | Admitting: *Deleted

## 2023-06-26 ENCOUNTER — Emergency Department (HOSPITAL_COMMUNITY)
Admission: EM | Admit: 2023-06-26 | Discharge: 2023-06-26 | Disposition: A | Discharge status: Home / Self Care | Attending: Emergency Medicine | Admitting: Emergency Medicine

## 2023-06-26 DIAGNOSIS — R0781 Pleurodynia: Secondary | ICD-10-CM | POA: Diagnosis present

## 2023-06-26 LAB — URINALYSIS, ROUTINE W REFLEX MICROSCOPIC
Bilirubin Urine: NEGATIVE
Glucose, UA: NEGATIVE mg/dL
Hgb urine dipstick: NEGATIVE
Ketones, ur: NEGATIVE mg/dL
Leukocytes,Ua: NEGATIVE
Nitrite: NEGATIVE
Protein, ur: NEGATIVE mg/dL
Specific Gravity, Urine: 1.018 (ref 1.005–1.030)
pH: 5 (ref 5.0–8.0)

## 2023-06-26 LAB — CBC
HCT: 38 % (ref 36.0–46.0)
Hemoglobin: 12.8 g/dL (ref 12.0–15.0)
MCH: 30.8 pg (ref 26.0–34.0)
MCHC: 33.7 g/dL (ref 30.0–36.0)
MCV: 91.3 fL (ref 80.0–100.0)
Platelets: 214 10*3/uL (ref 150–400)
RBC: 4.16 MIL/uL (ref 3.87–5.11)
RDW: 12.3 % (ref 11.5–15.5)
WBC: 4.2 10*3/uL (ref 4.0–10.5)
nRBC: 0 % (ref 0.0–0.2)

## 2023-06-26 LAB — LIPASE, BLOOD: Lipase: 32 U/L (ref 11–51)

## 2023-06-26 LAB — HCG, SERUM, QUALITATIVE: Preg, Serum: NEGATIVE

## 2023-06-26 LAB — COMPREHENSIVE METABOLIC PANEL WITH GFR
ALT: 10 U/L (ref 0–44)
AST: 17 U/L (ref 15–41)
Albumin: 3.6 g/dL (ref 3.5–5.0)
Alkaline Phosphatase: 29 U/L — ABNORMAL LOW (ref 38–126)
Anion gap: 8 (ref 5–15)
BUN: 8 mg/dL (ref 6–20)
CO2: 22 mmol/L (ref 22–32)
Calcium: 9.2 mg/dL (ref 8.9–10.3)
Chloride: 108 mmol/L (ref 98–111)
Creatinine, Ser: 0.8 mg/dL (ref 0.44–1.00)
GFR, Estimated: >60 mL/min (ref >60)
Glucose, Bld: 109 mg/dL — ABNORMAL HIGH (ref 70–99)
Potassium: 3.8 mmol/L (ref 3.5–5.1)
Sodium: 138 mmol/L (ref 135–145)
Total Bilirubin: 0.7 mg/dL (ref 0.0–1.2)
Total Protein: 6.5 g/dL (ref 6.5–8.1)

## 2023-06-26 MED ORDER — IOHEXOL 350 MG/ML SOLN
75.0000 mL | Freq: Once | INTRAVENOUS | Status: AC | PRN
  Administered 2023-06-26: 75 mL via INTRAVENOUS

## 2023-06-26 NOTE — Discharge Instructions (Addendum)
 Return for any problem.  ?

## 2023-06-26 NOTE — ED Provider Notes (Signed)
 [place] EMERG[facility] DEPARTMENT AT [facility] Provider Note   CSN: [id number] Arrival date & time: 06/26/23  2952     Patient presents with: Abdominal Pain   Julie Maddox is a 28 y.o. female.   28 year old female with prior medical history as detailed below presents for evaluation.  Patient reports that approximately 3 weeks ago she had a URI with cough and congestion.  URI symptoms resolved.  She complains of persistent right sided pleuritic chest pain since.  She denies shortness of breath.  She denies nausea or vomiting.  She denies fever.  Cough is resolved.  She takes an oral birth control agent and also has an drug-eluting IUD.  The history is provided by the patient and medical records.       Prior to Admission medications   Medication Sig Start Date End Date Taking? Authorizing Provider  baclofen  (LIORESAL ) 10 MG tablet Take 1 tablet (10 mg total) by mouth 3 (three) times daily. 08/16/22   Lora Robinsons, FNP  JUNEL FE 1/20 1-20 MG-MCG tablet TAKE 1 TABLET BY MOUTH 1 TIME EACH DAY. 08/09/22   [provider]  medroxyPROGESTERone Acetate (DEPO-PROVERA IM) Inject into the muscle.    [provider]  naproxen  (NAPROSYN ) 375 MG tablet Take 1 tablet (375 mg total) by mouth 2 (two) times daily. Starting tomorrow 08/16/22   Lora Robinsons, FNP  ondansetron  (ZOFRAN -ODT) 8 MG disintegrating tablet Take 1 tablet (8 mg total) by mouth every 8 (eight) hours as needed for nausea or vomiting. 11/16/21   Eloise Hake Scales, PA-C  Prenatal Vit-Fe Fumarate-FA (MULTIVITAMIN-PRENATAL) 27-0.8 MG TABS tablet Take 1 tablet by mouth daily at 12 noon.    [provider]    Allergies: Patient has no known allergies.    Review of Systems  All other systems reviewed and are negative.   Updated Vital Signs BP 119/89 (BP Location: Right Arm)   Pulse 80   Temp 97.8 F (36.6 C)   Resp 15   Ht 5' 7 (1.702 m)   Wt 74.8 kg   SpO2 99%   BMI 25.84 kg/m    Physical Exam Vitals and nursing note reviewed.  Constitutional:      General: She is not in acute distress.    Appearance: Normal appearance. She is well-developed.  HENT:     Head: Normocephalic and atraumatic.   Eyes:     Conjunctiva/sclera: Conjunctivae normal.     Pupils: Pupils are equal, round, and reactive to light.    Cardiovascular:     Rate and Rhythm: Normal rate and regular rhythm.     Heart sounds: Normal heart sounds.  Pulmonary:     Effort: Pulmonary effort is normal. No respiratory distress.     Breath sounds: Normal breath sounds.  Abdominal:     General: There is no distension.     Palpations: Abdomen is soft.     Tenderness: There is no abdominal tenderness.   Musculoskeletal:        General: No deformity. Normal range of motion.     Cervical back: Normal range of motion and neck supple.   Skin:    General: Skin is warm and dry.   Neurological:     General: No focal deficit present.     Mental Status: She is alert and oriented to person, place, and time.     (all labs ordered are listed, but only abnormal results are displayed) Labs Reviewed  CBC  URINALYSIS, ROUTINE W  REFLEX MICROSCOPIC  LIPASE, BLOOD  COMPREHENSIVE METABOLIC PANEL WITH GFR  HCG, SERUM, QUALITATIVE    EKG: EKG Interpretation Date/Time:  Saturday June 26 2023 07:19:23 EDT Ventricular Rate:  66 PR Interval:  142 QRS Duration:  92 QT Interval:  374 QTC Calculation: 392 R Axis:   79  Text Interpretation: Normal sinus rhythm with sinus arrhythmia Normal ECG No previous ECGs available Confirmed by Angela Kell [id number] on 06/26/2023 7:38:45 AM  Radiology: No results found.   Procedures   Medications Ordered in the ED - No data to display                                  Medical Decision Making Amount and/or Complexity of Data Reviewed Labs: ordered. Radiology: ordered.  Risk Prescription drug management.    Medical Screen Complete  This patient  presented to the ED with complaint of right sided pleuritic chest pain.  This complaint involves an extensive number of treatment options. The initial differential diagnosis includes, but is not limited to, muscle strain, PE, pneumonia, pneumothorax, etc.  This presentation is: Acute, Chronic, Self-Limited, Previously Undiagnosed, Uncertain Prognosis, Complicated, Systemic Symptoms, and Threat to Life/Bodily Function   Patient is presenting with 3 weeks of right-sided chest pain.  It is pleuritic in nature.    Obtained labs and imaging are without significant cute abnormality.    Fortunately workup does not demonstrate a PE or other significant abnormality.  Patient reassured by findings.  She desires discharge home.  Importance of close follow-up is stressed.  Additional history obtained: External records from outside sources obtained and reviewed including prior ED visits and prior Inpatient records.    Problem List / ED Course:  Right sided pleuritic chest pain   Disposition:  After consideration of the diagnostic results and the patients response to treatment, I feel that the patent would benefit from close outpatient follow-up.       Final diagnoses:  Pleuritic chest pain    ED Discharge Orders     None          Burnette Carte, MD 06/26/23 1649

## 2023-06-26 NOTE — ED Triage Notes (Signed)
 C/o pain under right breast x 3 weeks , states she had a cold with a cough, not getting better
